# Patient Record
Sex: Female | Born: 1990 | Race: Black or African American | Hispanic: No | Marital: Single | Smoking: Current every day smoker
Health system: Southern US, Community
[De-identification: ages and names within clinical notes are randomized; demographics above are authoritative.]

## PROBLEM LIST (undated history)

## (undated) DIAGNOSIS — A4902 Methicillin resistant Staphylococcus aureus infection, unspecified site: Secondary | ICD-10-CM

## (undated) DIAGNOSIS — Z9289 Personal history of other medical treatment: Secondary | ICD-10-CM

## (undated) DIAGNOSIS — L03221 Cellulitis of neck: Secondary | ICD-10-CM

## (undated) DIAGNOSIS — L0211 Cutaneous abscess of neck: Secondary | ICD-10-CM

## (undated) HISTORY — PX: WISDOM TOOTH EXTRACTION: SHX21

## (undated) HISTORY — PX: TONSILLECTOMY AND ADENOIDECTOMY: SUR1326

## (undated) HISTORY — PX: COLONOSCOPY W/ BIOPSIES: SHX1374

---
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
## 2012-01-21 ENCOUNTER — Other Ambulatory Visit: Payer: Self-pay | Admitting: Pediatrics

## 2012-01-21 DIAGNOSIS — K523 Indeterminate colitis: Secondary | ICD-10-CM

## 2012-01-22 NOTE — Telephone Encounter (Signed)
Here's one 

## 2012-02-21 ENCOUNTER — Other Ambulatory Visit: Payer: Self-pay | Admitting: Pediatrics

## 2012-02-21 ENCOUNTER — Encounter: Payer: Self-pay | Admitting: Pediatrics

## 2012-02-21 ENCOUNTER — Ambulatory Visit (INDEPENDENT_AMBULATORY_CARE_PROVIDER_SITE_OTHER): Payer: Medicaid Other | Admitting: Pediatrics

## 2012-02-21 VITALS — BP 112/59 | HR 75 | Temp 97.1°F | Ht 65.5 in | Wt 252.0 lb

## 2012-02-21 DIAGNOSIS — K523 Indeterminate colitis: Secondary | ICD-10-CM

## 2012-02-21 DIAGNOSIS — E669 Obesity, unspecified: Secondary | ICD-10-CM

## 2012-02-21 DIAGNOSIS — K5289 Other specified noninfective gastroenteritis and colitis: Secondary | ICD-10-CM

## 2012-02-21 LAB — CBC WITH DIFFERENTIAL/PLATELET
Basophils Absolute: 0 10*3/uL (ref 0.0–0.1)
Eosinophils Relative: 8 % — ABNORMAL HIGH (ref 0–5)
HCT: 32.7 % — ABNORMAL LOW (ref 36.0–46.0)
Hemoglobin: 10.3 g/dL — ABNORMAL LOW (ref 12.0–15.0)
Lymphocytes Relative: 23 % (ref 12–46)
Lymphs Abs: 2.1 10*3/uL (ref 0.7–4.0)
MCV: 75.7 fL — ABNORMAL LOW (ref 78.0–100.0)
Monocytes Absolute: 0.4 10*3/uL (ref 0.1–1.0)
Neutro Abs: 5.9 10*3/uL (ref 1.7–7.7)
RBC: 4.32 MIL/uL (ref 3.87–5.11)
RDW: 17.4 % — ABNORMAL HIGH (ref 11.5–15.5)
WBC: 9.1 10*3/uL (ref 4.0–10.5)

## 2012-02-21 LAB — HEPATIC FUNCTION PANEL
AST: 16 U/L (ref 0–37)
Bilirubin, Direct: 0.1 mg/dL (ref 0.0–0.3)
Indirect Bilirubin: 0.4 mg/dL (ref 0.0–0.9)
Total Bilirubin: 0.5 mg/dL (ref 0.3–1.2)

## 2012-02-21 NOTE — Patient Instructions (Signed)
Keep Prednisone 30 mg daily, Immuran 150 mg every morning and iron once daily. Will refer to [facility] for fiurther care due to advanced age.

## 2012-02-21 NOTE — Progress Notes (Signed)
Subjective:     Patient ID: Alexandra Ramsey, female   DOB: 05-09-1991, 21 y.o.   MRN: [id number] BP 112/59  Pulse 75  Temp(Src) 97.1 F (36.2 C) (Oral)  Ht 5' 5.5" (1.664 m)  Wt 252 lb (114.306 kg)  BMI 41.30 kg/m2. 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 Weight stable. Had one week of cramping/bloody stools without interruption of meds/dietary restrictions. Good compliance with Prednisone 30 mg QAM, Imuran 150 mg QAM and FeSO4 325 mg daily. Finishing second quarter of college. No fever, arthralgia, diarrhea, etc  Review of Systems  Constitutional: Negative for fever, activity change, appetite change and unexpected weight change.  HENT: Negative.   Respiratory: Negative.  Negative for cough and wheezing.   Cardiovascular: Negative.  Negative for chest pain.  Gastrointestinal: Negative for nausea, vomiting, abdominal pain, diarrhea, constipation, blood in stool, abdominal distention and rectal pain.  Genitourinary: Negative.  Negative for dysuria, hematuria, flank pain, difficulty urinating and menstrual problem.  Musculoskeletal: Negative.  Negative for arthralgias.  Neurological: Negative.  Negative for headaches.  Hematological: Negative.   Psychiatric/Behavioral: Negative.        Objective:   Physical Exam  Nursing note and vitals reviewed. Constitutional: She appears well-developed and well-nourished. No distress.  HENT:  Head: Normocephalic and atraumatic.  Eyes: Conjunctivae are normal.  Neck: Normal range of motion. Neck supple. No thyromegaly present.  Cardiovascular: Normal rate, regular rhythm and normal heart sounds.   No murmur heard. Pulmonary/Chest: Effort normal and breath sounds normal. She has no wheezes.  Abdominal: Soft. Bowel sounds are normal. She exhibits no distension and no mass. There is no tenderness.  Musculoskeletal: Normal range of motion. She exhibits no edema.  Lymphadenopathy:    She has no cervical adenopathy.    Neurological: She is alert.  Skin: Skin is warm and dry. No rash noted.  Psychiatric: She has a normal mood and affect. Her behavior is normal.       Assessment:   Indeterminate colitis-doing well  Obesity    Plan:   Keep meds same for now  CBC/SR/LFTs Refer to [facility] for 3 month followup appt and ongoing care

## 2012-07-18 ENCOUNTER — Emergency Department (HOSPITAL_COMMUNITY)
Admission: EM | Admit: 2012-07-18 | Discharge: 2012-07-18 | Disposition: A | Discharge status: Home / Self Care | Payer: Medicaid Other | Attending: Emergency Medicine | Admitting: Emergency Medicine

## 2012-07-18 ENCOUNTER — Encounter (HOSPITAL_COMMUNITY): Payer: Self-pay | Admitting: Emergency Medicine

## 2012-07-18 DIAGNOSIS — F17200 Nicotine dependence, unspecified, uncomplicated: Secondary | ICD-10-CM | POA: Insufficient documentation

## 2012-07-18 DIAGNOSIS — J069 Acute upper respiratory infection, unspecified: Secondary | ICD-10-CM | POA: Insufficient documentation

## 2012-07-18 MED ORDER — GUAIFENESIN 100 MG/5ML PO SYRP: 100.0000 mg | ORAL_SOLUTION | ORAL | Status: DC | PRN

## 2012-07-18 MED ORDER — ACETAMINOPHEN 500 MG PO TABS: 500.0000 mg | ORAL_TABLET | Freq: Four times a day (QID) | ORAL | Status: DC | PRN

## 2012-07-18 NOTE — ED Provider Notes (Signed)
History     CSN: [id number]  Arrival date & time 07/18/12  5638   First MD Initiated Contact with Patient 07/18/12 [phone]      No chief complaint on file.   (Consider location/radiation/quality/duration/timing/severity/associated sxs/prior treatment) HPI  21 year old female with history of UC currently on Imuran presents with cold symptoms. Reports for the past 2 days she has had frontal headache, nasal congestion, runny nose, sore throat, ear pain, cough productive of yellow sputum and body aches.  Also endorse 2 bouts of non-bloody diarrhea since yesterday, no fever or vomiting.  Is a smoker.  Has not take any treatment for her sxs.  No abdominal pain, back pain, urinary sxs or rash.  No recent travel, no tick exposure.    Past Medical History  Diagnosis Date  . Ulcerative colitis 05/29/08 & 04/29/10    Colonoscopy with biopsy Dx'd  . Ulcerative colitis     Past Surgical History  Procedure Date  . Colonoscopy w/ biopsies 05/29/08 and 05/08/10    Biopsy consistent with colitis    Family History  Problem Relation Age of Onset  . Colon polyps Father   . Hypertension Father   . Colon polyps Maternal Grandmother   . Colon polyps Paternal Grandmother   . Inflammatory bowel disease Neg Hx   . Hypertension Mother     History  Substance Use Topics  . Smoking status: Former Smoker -- 0.1 packs/day    Types: Cigarettes  . Smokeless tobacco: Not on file  . Alcohol Use: No    OB History    Grav Para Term Preterm Abortions TAB SAB Ect Mult Living                  Review of Systems  All other systems reviewed and are negative.    Allergies  Review of patient's allergies indicates no known allergies.  Home Medications   Current Outpatient Rx  Name Route Sig Dispense Refill  . AZATHIOPRINE 50 MG PO TABS  TAKE THREE TABLETS BY MOUTH EVERY DAY IN THE MORNING 100 tablet 5  . FERROUS SULFATE 325 (65 FE) MG PO TABS Oral Take 1 tablet (325 mg total) by mouth daily. 100 tablet 5   . PREDNISONE 20 MG PO TABS Oral Take 1.5 tablets (30 mg total) by mouth daily. 50 tablet 5    BP 139/76  Pulse 94  Temp 98.6 F (37 C) (Oral)  Resp 16  SpO2 100%  Physical Exam  Nursing note and vitals reviewed. Constitutional: She appears well-developed and well-nourished. No distress.       Awake, alert, nontoxic appearance  HENT:  Head: Atraumatic.  Right Ear: External ear normal.  Left Ear: External ear normal.  Nose: Nose normal.  Mouth/Throat: Oropharynx is clear and moist. No oropharyngeal exudate.  Eyes: Conjunctivae normal are normal. Right eye exhibits no discharge. Left eye exhibits no discharge.  Neck: Neck supple.  Cardiovascular: Normal rate and regular rhythm.   Pulmonary/Chest: Effort normal. No respiratory distress. She has no wheezes. She has no rales. She exhibits no tenderness.  Abdominal: Soft. There is no tenderness. There is no rebound.  Musculoskeletal: She exhibits no tenderness.       ROM appears intact, no obvious focal weakness  Lymphadenopathy:    She has no cervical adenopathy.  Neurological:       Mental status and motor strength appears intact  Skin: No rash noted.  Psychiatric: She has a normal mood and affect.    ED Course  Procedures (including critical care time)  Labs Reviewed - No data to display No results found.   No diagnosis found.  1. URI   MDM  Pt with cold sxs, no obvious signs of infection on exam.  abd nontender.  No rash. Plan to treat with guaifenesin, tylenol.  Pt is afebrile with stable vital sign.    BP 139/76  Pulse 94  Temp 98.6 F (37 C) (Oral)  Resp 16  SpO2 100%  LMP 06/15/2012  Nursing notes reviewed and considered in documentation  Previous records reviewed and considered           Fayrene Helper, PA-C 07/18/12 4098

## 2012-07-18 NOTE — ED Notes (Signed)
Pt states she has had cold sx (sore throat, congestion) x 2 days. Also states diarrhea since last night 3x. Took Nyquil last night for sx.

## 2012-07-22 NOTE — ED Provider Notes (Signed)
Medical screening examination/treatment/procedure(s) were performed by non-physician practitioner and as supervising physician I was immediately available for consultation/collaboration.   Orlandria Kissner E Raciel Caffrey, MD 07/22/12 0846 

## 2012-08-14 ENCOUNTER — Other Ambulatory Visit: Payer: Self-pay | Admitting: Pediatrics

## 2012-08-14 DIAGNOSIS — K523 Indeterminate colitis: Secondary | ICD-10-CM

## 2012-08-15 NOTE — Telephone Encounter (Signed)
Has been referred to adult GI

## 2013-01-08 ENCOUNTER — Other Ambulatory Visit: Payer: Self-pay | Admitting: Gastroenterology

## 2013-01-08 DIAGNOSIS — K529 Noninfective gastroenteritis and colitis, unspecified: Secondary | ICD-10-CM

## 2013-01-08 DIAGNOSIS — R1032 Left lower quadrant pain: Secondary | ICD-10-CM

## 2013-01-14 ENCOUNTER — Inpatient Hospital Stay: Admission: RE | Admit: 2013-01-14 | Payer: Medicaid Other | Source: Ambulatory Visit

## 2013-03-16 ENCOUNTER — Encounter (HOSPITAL_COMMUNITY): Payer: Self-pay | Admitting: *Deleted

## 2013-03-16 ENCOUNTER — Emergency Department (INDEPENDENT_AMBULATORY_CARE_PROVIDER_SITE_OTHER)
Admission: EM | Admit: 2013-03-16 | Discharge: 2013-03-16 | Disposition: A | Discharge status: Home / Self Care | Payer: Medicaid Other | Source: Home / Self Care

## 2013-03-16 DIAGNOSIS — K529 Noninfective gastroenteritis and colitis, unspecified: Secondary | ICD-10-CM

## 2013-03-16 DIAGNOSIS — K5289 Other specified noninfective gastroenteritis and colitis: Secondary | ICD-10-CM

## 2013-03-16 MED ORDER — METHYLPREDNISOLONE ACETATE 40 MG/ML IJ SUSP
80.0000 mg | Freq: Once | INTRAMUSCULAR | Status: AC
  Administered 2013-03-16: 80 mg via INTRAMUSCULAR

## 2013-03-16 MED ORDER — METHYLPREDNISOLONE ACETATE 80 MG/ML IJ SUSP
INTRAMUSCULAR | Status: AC
  Filled 2013-03-16: qty 1

## 2013-03-16 MED ORDER — METHYLPREDNISOLONE 4 MG PO KIT: PACK | ORAL | Status: DC

## 2013-03-16 NOTE — ED Provider Notes (Signed)
History     CSN: [id number]  Arrival date & time 03/16/13  1134   None     Chief Complaint  Patient presents with  . Abdominal Pain    (Consider location/radiation/quality/duration/timing/severity/associated sxs/prior treatment) Patient is a 22 y.o. female presenting with abdominal pain. The history is provided by the patient.  Abdominal Pain Pain location:  Periumbilical Pain quality: cramping   Pain radiates to:  Suprapubic region Pain severity:  Mild Onset quality:  Sudden Duration:  2 days Timing:  Intermittent Progression:  Worsening Chronicity:  Chronic Context comment:  H/o UC with mult meds, onset yest with cramps, today with nonbloody diarrhea,similar to prev flare-ups. Associated symptoms: no fever, no nausea and no vomiting     Past Medical History  Diagnosis Date  . Ulcerative colitis 05/29/08 & 04/29/10    Colonoscopy with biopsy Dx'd  . Ulcerative colitis     Past Surgical History  Procedure Laterality Date  . Colonoscopy w/ biopsies  05/29/08 and 05/08/10    Biopsy consistent with colitis    Family History  Problem Relation Age of Onset  . Colon polyps Father   . Hypertension Father   . Colon polyps Maternal Grandmother   . Colon polyps Paternal Grandmother   . Inflammatory bowel disease Neg Hx   . Hypertension Mother     History  Substance Use Topics  . Smoking status: Former Smoker -- 0.10 packs/day    Types: Cigarettes  . Smokeless tobacco: Not on file  . Alcohol Use: No    OB History   Grav Para Term Preterm Abortions TAB SAB Ect Mult Living                  Review of Systems  Constitutional: Negative for fever.  Gastrointestinal: Positive for abdominal pain. Negative for nausea and vomiting.  Genitourinary: Negative.     Allergies  Review of patient's allergies indicates no known allergies.  Home Medications   Current Outpatient Rx  Name  Route  Sig  Dispense  Refill  . azaTHIOprine (IMURAN) 50 MG tablet   Oral   Take  150 mg by mouth daily.         Marland Kitchen acetaminophen (TYLENOL) 500 MG tablet   Oral   Take 1 tablet (500 mg total) by mouth every 6 (six) hours as needed for pain.   30 tablet   0   . EXPIRED: ferrous sulfate 325 (65 FE) MG tablet   Oral   Take 325 mg by mouth daily.         Marland Kitchen guaifenesin (ROBITUSSIN) 100 MG/5ML syrup   Oral   Take 5-10 mLs (100-200 mg total) by mouth every 4 (four) hours as needed for cough.   60 mL   0   . methylPREDNISolone (MEDROL DOSEPAK) 4 MG tablet      follow package directions, start on mon, take until finished.   21 tablet   0   . EXPIRED: predniSONE (DELTASONE) 20 MG tablet   Oral   Take 30 mg by mouth daily.         . predniSONE (DELTASONE) 20 MG tablet      TAKE ONE TABLET BY MOUTH AS DIRECTED   60 tablet   4     BP 127/71  Pulse 86  Temp(Src) 98.1 F (36.7 C) (Oral)  Resp 15  SpO2 98%  LMP 02/28/2013  Physical Exam  Nursing note and vitals reviewed. Constitutional: She is oriented to person, place, and time.  She appears well-developed and well-nourished. No distress.  Pain 6/10.  Neck: Normal range of motion. Neck supple.  Cardiovascular: Regular rhythm.   Pulmonary/Chest: Breath sounds normal.  Abdominal: Soft. She exhibits no distension and no mass. Bowel sounds are decreased. There is no hepatosplenomegaly. There is tenderness in the periumbilical area and suprapubic area. There is no rigidity, no rebound, no guarding and no CVA tenderness.  Lymphadenopathy:    She has no cervical adenopathy.  Neurological: She is alert and oriented to person, place, and time.  Skin: Skin is warm and dry.    ED Course  Procedures (including critical care time)  Labs Reviewed - No data to display No results found.   1. Acute colitis       MDM          Linna Hoff, MD 03/16/13 1253

## 2013-03-16 NOTE — ED Notes (Signed)
Patient complains of abdominal pain in the bottom of her stomach. Sates she thought it is a flair up as she has ulcerative colitis. States Diarrhea. Denies fever/chills.

## 2013-06-14 ENCOUNTER — Encounter (HOSPITAL_COMMUNITY): Payer: Self-pay | Admitting: Emergency Medicine

## 2013-06-14 ENCOUNTER — Emergency Department (HOSPITAL_COMMUNITY)
Admission: EM | Admit: 2013-06-14 | Discharge: 2013-06-14 | Disposition: A | Discharge status: Home / Self Care | Payer: Self-pay | Attending: Emergency Medicine | Admitting: Emergency Medicine

## 2013-06-14 DIAGNOSIS — Z87891 Personal history of nicotine dependence: Secondary | ICD-10-CM | POA: Insufficient documentation

## 2013-06-14 DIAGNOSIS — Z79899 Other long term (current) drug therapy: Secondary | ICD-10-CM | POA: Insufficient documentation

## 2013-06-14 DIAGNOSIS — Z8719 Personal history of other diseases of the digestive system: Secondary | ICD-10-CM | POA: Insufficient documentation

## 2013-06-14 DIAGNOSIS — R209 Unspecified disturbances of skin sensation: Secondary | ICD-10-CM | POA: Insufficient documentation

## 2013-06-14 DIAGNOSIS — M25539 Pain in unspecified wrist: Secondary | ICD-10-CM | POA: Insufficient documentation

## 2013-06-14 DIAGNOSIS — IMO0002 Reserved for concepts with insufficient information to code with codable children: Secondary | ICD-10-CM | POA: Insufficient documentation

## 2013-06-14 DIAGNOSIS — M25449 Effusion, unspecified hand: Secondary | ICD-10-CM | POA: Insufficient documentation

## 2013-06-14 DIAGNOSIS — M25532 Pain in left wrist: Secondary | ICD-10-CM

## 2013-06-14 MED ORDER — IBUPROFEN 400 MG PO TABS
800.0000 mg | ORAL_TABLET | Freq: Once | ORAL | Status: AC
  Administered 2013-06-14: 800 mg via ORAL
  Filled 2013-06-14: qty 2

## 2013-06-14 MED ORDER — METHOCARBAMOL 500 MG PO TABS: 500.0000 mg | ORAL_TABLET | Freq: Two times a day (BID) | ORAL | Status: DC

## 2013-06-14 MED ORDER — IBUPROFEN 800 MG PO TABS: 800.0000 mg | ORAL_TABLET | Freq: Three times a day (TID) | ORAL | Status: DC

## 2013-06-14 NOTE — ED Provider Notes (Signed)
CSN: [id number]     Arrival date & time 06/14/13  2203 History  This chart was scribed for non-physician practitioner, Fayrene Helper, PA-C working with Gavin Pound. Oletta Lamas, MD by Greggory Stallion, ED scribe. This patient was seen in room TR06C/TR06C and the patient's care was started at 10:15 PM.   Chief Complaint  Patient presents with  . Wrist Pain   The history is provided by the patient. No language interpreter was used.    HPI Comments: Alexandra Ramsey is a 22 y.o. female who presents to the Emergency Department complaining of gradual onset, constant throbbing left wrist pain and swelling that started around 6 PM tonight while she was at work. She denies injury. Pt is right handed. She states certain movements worsen the pain. Pt has some numbness in her fingers. She has not taken anything to relieve the symptoms. Pt denies fever, chills and shoulder pain as associated symptoms. She states no one at work is having any pain or swelling.   Past Medical History  Diagnosis Date  . Ulcerative colitis 05/29/08 & 04/29/10    Colonoscopy with biopsy Dx'd  . Ulcerative colitis    Past Surgical History  Procedure Laterality Date  . Colonoscopy w/ biopsies  05/29/08 and 05/08/10    Biopsy consistent with colitis   Family History  Problem Relation Age of Onset  . Colon polyps Father   . Hypertension Father   . Colon polyps Maternal Grandmother   . Colon polyps Paternal Grandmother   . Inflammatory bowel disease Neg Hx   . Hypertension Mother    History  Substance Use Topics  . Smoking status: Former Smoker -- 0.10 packs/day    Types: Cigarettes  . Smokeless tobacco: Not on file  . Alcohol Use: No   OB History   Grav Para Term Preterm Abortions TAB SAB Ect Mult Living                 Review of Systems  Musculoskeletal: Positive for joint swelling and arthralgias.  Neurological: Positive for numbness.  All other systems reviewed and are negative.    Allergies  Review of patient's  allergies indicates no known allergies.  Home Medications   Current Outpatient Rx  Name  Route  Sig  Dispense  Refill  . acetaminophen (TYLENOL) 500 MG tablet   Oral   Take 1 tablet (500 mg total) by mouth every 6 (six) hours as needed for pain.   30 tablet   0   . azaTHIOprine (IMURAN) 50 MG tablet   Oral   Take 150 mg by mouth daily.         Marland Kitchen EXPIRED: ferrous sulfate 325 (65 FE) MG tablet   Oral   Take 325 mg by mouth daily.         Marland Kitchen guaifenesin (ROBITUSSIN) 100 MG/5ML syrup   Oral   Take 5-10 mLs (100-200 mg total) by mouth every 4 (four) hours as needed for cough.   60 mL   0   . methylPREDNISolone (MEDROL DOSEPAK) 4 MG tablet      follow package directions, start on mon, take until finished.   21 tablet   0   . EXPIRED: predniSONE (DELTASONE) 20 MG tablet   Oral   Take 30 mg by mouth daily.         . predniSONE (DELTASONE) 20 MG tablet      TAKE ONE TABLET BY MOUTH AS DIRECTED   60 tablet   4  BP 128/70  Pulse 78  Temp(Src) 98.2 F (36.8 C) (Oral)  Resp 16  SpO2 100%  LMP 05/18/2013  Physical Exam  Nursing note and vitals reviewed. Constitutional: She is oriented to person, place, and time. She appears well-developed and well-nourished. No distress.  HENT:  Head: Normocephalic and atraumatic.  Eyes: EOM are normal.  Neck: Neck supple. No tracheal deviation present.  Cardiovascular: Normal rate.   Brisk cap refill. Radial pulses 2+.   Pulmonary/Chest: Effort normal. No respiratory distress.  Musculoskeletal: Normal range of motion.  Left wrist mod tender throught wrist with edema noted. Increased pain with wrist flexion and extension and arm sublation. Decreased grip strength due to pain. No deformity. No rash. Normal left elbow and left shoulder.   Neurological: She is alert and oriented to person, place, and time.  Skin: Skin is warm and dry.  Psychiatric: She has a normal mood and affect. Her behavior is normal.    ED Course    Procedures (including critical care time)  DIAGNOSTIC STUDIES: Oxygen Saturation is 100% on RA, normal by my interpretation.    COORDINATION OF CARE: 10:41 PM-Discussed treatment plan which includes wrapping wrist and pain medication with pt at bedside and pt agreed to plan. Advised pt xray is not necessary since she did not injured the wrist and to follow up with orthopaedist if symptoms do not resolve. Spoke with pt there is no septic arthritis since she has no fevers. Doubt gout due to age. Pt was given warning signs of infection to return to ED.   Labs Reviewed - No data to display No results found. 1. Left wrist pain     MDM  BP 128/70  Pulse 78  Temp(Src) 98.2 F (36.8 C) (Oral)  Resp 16  SpO2 100%  LMP 05/18/2013   I personally performed the services described in this documentation, which was scribed in my presence. The recorded information has been reviewed and is accurate.    Fayrene Helper, PA-C 06/14/13 2248

## 2013-06-14 NOTE — ED Notes (Signed)
PT. REPORTS LEFT WRIST PAIN / SWELLING ONSET THIS EVENING , DENIES INJURY.

## 2013-06-15 NOTE — ED Provider Notes (Signed)
Medical screening examination/treatment/procedure(s) were performed by non-physician practitioner and as supervising physician I was immediately available for consultation/collaboration.   Nicodemus Denk Y. Veryl Abril, MD 06/15/13 1738 

## 2013-06-24 ENCOUNTER — Other Ambulatory Visit: Payer: Self-pay | Admitting: Pediatrics

## 2013-06-25 ENCOUNTER — Other Ambulatory Visit: Payer: Self-pay | Admitting: Pediatrics

## 2013-06-25 NOTE — Telephone Encounter (Signed)
Isn't she gone?

## 2013-06-27 ENCOUNTER — Encounter (HOSPITAL_COMMUNITY): Payer: Self-pay | Admitting: Emergency Medicine

## 2013-06-27 ENCOUNTER — Emergency Department (HOSPITAL_COMMUNITY): Payer: Self-pay

## 2013-06-27 ENCOUNTER — Emergency Department (HOSPITAL_COMMUNITY)
Admission: EM | Admit: 2013-06-27 | Discharge: 2013-06-27 | Disposition: A | Discharge status: Home / Self Care | Payer: Self-pay | Attending: Emergency Medicine | Admitting: Emergency Medicine

## 2013-06-27 DIAGNOSIS — S0510XA Contusion of eyeball and orbital tissues, unspecified eye, initial encounter: Secondary | ICD-10-CM | POA: Insufficient documentation

## 2013-06-27 DIAGNOSIS — K5100 Ulcerative (chronic) pancolitis without complications: Secondary | ICD-10-CM | POA: Insufficient documentation

## 2013-06-27 DIAGNOSIS — Z79899 Other long term (current) drug therapy: Secondary | ICD-10-CM | POA: Insufficient documentation

## 2013-06-27 DIAGNOSIS — S0512XA Contusion of eyeball and orbital tissues, left eye, initial encounter: Secondary | ICD-10-CM

## 2013-06-27 DIAGNOSIS — Z87891 Personal history of nicotine dependence: Secondary | ICD-10-CM | POA: Insufficient documentation

## 2013-06-27 MED ORDER — TETRACAINE HCL 0.5 % OP SOLN
1.0000 [drp] | Freq: Once | OPHTHALMIC | Status: AC
  Administered 2013-06-27: 1 [drp] via OPHTHALMIC
  Filled 2013-06-27: qty 2

## 2013-06-27 MED ORDER — FLUORESCEIN SODIUM 1 MG OP STRP
1.0000 | ORAL_STRIP | Freq: Once | OPHTHALMIC | Status: AC
  Administered 2013-06-27: 1 via OPHTHALMIC
  Filled 2013-06-27: qty 1
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
## 2013-06-27 NOTE — ED Notes (Signed)
Discharge instructions reviewed. Pt verbalized understanding.  

## 2013-06-27 NOTE — ED Provider Notes (Signed)
Medical screening examination/treatment/procedure(s) were performed by non-physician practitioner and as supervising physician I was immediately available for consultation/collaboration.   Shanna Cisco, MD 06/27/13 1958
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
## 2013-07-12 ENCOUNTER — Emergency Department (HOSPITAL_COMMUNITY)
Admission: EM | Admit: 2013-07-12 | Discharge: 2013-07-12 | Disposition: A | Discharge status: Home / Self Care | Payer: Medicaid Other | Attending: Emergency Medicine | Admitting: Emergency Medicine

## 2013-07-12 ENCOUNTER — Encounter (HOSPITAL_COMMUNITY): Payer: Self-pay | Admitting: Emergency Medicine

## 2013-07-12 DIAGNOSIS — S0011XA Contusion of right eyelid and periocular area, initial encounter: Secondary | ICD-10-CM

## 2013-07-12 DIAGNOSIS — S0010XA Contusion of unspecified eyelid and periocular area, initial encounter: Secondary | ICD-10-CM | POA: Insufficient documentation

## 2013-07-12 DIAGNOSIS — IMO0002 Reserved for concepts with insufficient information to code with codable children: Secondary | ICD-10-CM | POA: Insufficient documentation

## 2013-07-12 DIAGNOSIS — Z87891 Personal history of nicotine dependence: Secondary | ICD-10-CM | POA: Insufficient documentation

## 2013-07-12 DIAGNOSIS — Z79899 Other long term (current) drug therapy: Secondary | ICD-10-CM | POA: Insufficient documentation

## 2013-07-12 DIAGNOSIS — K5190 Ulcerative colitis, unspecified, without complications: Secondary | ICD-10-CM | POA: Insufficient documentation

## 2013-07-12 MED ORDER — TETRACAINE HCL 0.5 % OP SOLN
1.0000 [drp] | Freq: Once | OPHTHALMIC | Status: AC
  Administered 2013-07-12: 1 [drp] via OPHTHALMIC
  Filled 2013-07-12: qty 2

## 2013-07-12 MED ORDER — FLUORESCEIN SODIUM 1 MG OP STRP
1.0000 | ORAL_STRIP | Freq: Once | OPHTHALMIC | Status: AC
  Administered 2013-07-12: 1 via OPHTHALMIC
  Filled 2013-07-12: qty 1

## 2013-07-12 MED ORDER — HYDROCODONE-ACETAMINOPHEN 5-325 MG PO TABS: 2.0000 | ORAL_TABLET | ORAL | Status: DC | PRN
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
## 2013-07-13 NOTE — ED Provider Notes (Signed)
Medical screening examination/treatment/procedure(s) were performed by non-physician practitioner and as supervising physician I was immediately available for consultation/collaboration.  Nyaja Dubuque L Trenton Passow, MD 07/13/13 0106 

## 2013-10-22 ENCOUNTER — Encounter (HOSPITAL_COMMUNITY): Payer: Self-pay | Admitting: Emergency Medicine

## 2013-10-22 ENCOUNTER — Emergency Department (INDEPENDENT_AMBULATORY_CARE_PROVIDER_SITE_OTHER)
Admission: EM | Admit: 2013-10-22 | Discharge: 2013-10-22 | Disposition: A | Discharge status: Home / Self Care | Payer: Self-pay | Source: Home / Self Care | Attending: Family Medicine | Admitting: Family Medicine

## 2013-10-22 DIAGNOSIS — R109 Unspecified abdominal pain: Secondary | ICD-10-CM

## 2013-10-22 DIAGNOSIS — K5289 Other specified noninfective gastroenteritis and colitis: Secondary | ICD-10-CM

## 2013-10-22 DIAGNOSIS — K523 Indeterminate colitis: Secondary | ICD-10-CM

## 2013-10-22 MED ORDER — PREDNISONE 20 MG PO TABS: 20.0000 mg | ORAL_TABLET | Freq: Two times a day (BID) | ORAL | Status: DC

## 2013-10-22 NOTE — ED Provider Notes (Signed)
Alexandra Ramsey is a 22 y.o. female who presents to Urgent Care today for abdominal pain for the last 2 days. Patient has a medical history significant for ulcerative colitis. This was previously well controlled with prednisone. She was taking 20 mg of prednisone twice daily until November when her medication ran out. She has not followed up with her doctor or a gastroenterologist as instructed for this and does not have medication refills. Additionally her medication has expired and she has not attempted to refill this as well. She was doing reasonably well up until 2 days ago. She denies any diarrhea. She says that her mild abdominal pain is consistent with very early ulcerative colitis flare. She is concerned that she may get worse. No fevers or chills nausea vomiting or diarrhea.   Past Medical History  Diagnosis Date  . Ulcerative colitis 05/29/08 & 04/29/10    Colonoscopy with biopsy Dx'd  . Ulcerative colitis    History  Substance Use Topics  . Smoking status: Former Smoker -- 0.10 packs/day    Types: Cigarettes  . Smokeless tobacco: Not on file  . Alcohol Use: No   ROS as above Medications reviewed. No current facility-administered medications for this encounter.   Current Outpatient Prescriptions  Medication Sig Dispense Refill  . HYDROcodone-acetaminophen (NORCO/VICODIN) 5-325 MG per tablet Take 2 tablets by mouth every 4 (four) hours as needed for pain.  13 tablet  0  . predniSONE (DELTASONE) 20 MG tablet Take 1 tablet (20 mg total) by mouth 2 (two) times daily with a meal.  60 tablet  0    Exam:  BP 105/51  Pulse 75  Temp(Src) 98.7 F (37.1 C) (Oral)  Resp 16  SpO2 100%  LMP 10/10/2013 Gen: Well NAD obese HEENT: EOMI,  MMM Lungs: Normal work of breathing. CTABL Heart: RRR no MRG Abd: NABS, Soft. NT, ND Exts: Non edematous BL  LE, warm and well perfused.   Assessment and Plan: 22 y.o. female with mild abdominal pain consistent with ulcerative colitis flare. Plan to  restart prednisone. Patient should followup with her primary care provider. She made him to apply for Medicaid or inquire about the affordable care act.  I provided multiple resource options for the patient. I additionally stressed the importance of paying attention to and taking care of this potentially very serious chronic medical problem. Discussed warning signs or symptoms. Please see discharge instructions. Patient expresses understanding.      Rodolph Bong, MD 10/22/13 [phone]

## 2013-10-22 NOTE — ED Notes (Signed)
22 yr old is here today with complaints of stomach pain since Monday. Pt states she has a Hx of Ulcerative Colitis. Denies: SOB, Chest PAin

## 2013-12-20 ENCOUNTER — Emergency Department (HOSPITAL_COMMUNITY)
Admission: EM | Admit: 2013-12-20 | Discharge: 2013-12-20 | Disposition: A | Discharge status: Home / Self Care | Payer: Medicaid Other | Attending: Emergency Medicine | Admitting: Emergency Medicine

## 2013-12-20 ENCOUNTER — Encounter (HOSPITAL_COMMUNITY): Payer: Self-pay | Admitting: Emergency Medicine

## 2013-12-20 ENCOUNTER — Emergency Department (HOSPITAL_COMMUNITY): Payer: Medicaid Other

## 2013-12-20 DIAGNOSIS — Z8719 Personal history of other diseases of the digestive system: Secondary | ICD-10-CM | POA: Insufficient documentation

## 2013-12-20 DIAGNOSIS — S93409A Sprain of unspecified ligament of unspecified ankle, initial encounter: Secondary | ICD-10-CM | POA: Insufficient documentation

## 2013-12-20 DIAGNOSIS — S93401A Sprain of unspecified ligament of right ankle, initial encounter: Secondary | ICD-10-CM

## 2013-12-20 DIAGNOSIS — Z87891 Personal history of nicotine dependence: Secondary | ICD-10-CM | POA: Insufficient documentation

## 2013-12-20 DIAGNOSIS — Y9301 Activity, walking, marching and hiking: Secondary | ICD-10-CM | POA: Insufficient documentation

## 2013-12-20 DIAGNOSIS — W010XXA Fall on same level from slipping, tripping and stumbling without subsequent striking against object, initial encounter: Secondary | ICD-10-CM | POA: Insufficient documentation

## 2013-12-20 DIAGNOSIS — X500XXA Overexertion from strenuous movement or load, initial encounter: Secondary | ICD-10-CM | POA: Insufficient documentation

## 2013-12-20 DIAGNOSIS — Z8781 Personal history of (healed) traumatic fracture: Secondary | ICD-10-CM | POA: Insufficient documentation

## 2013-12-20 DIAGNOSIS — Z79899 Other long term (current) drug therapy: Secondary | ICD-10-CM | POA: Insufficient documentation

## 2013-12-20 DIAGNOSIS — Y929 Unspecified place or not applicable: Secondary | ICD-10-CM | POA: Insufficient documentation

## 2013-12-20 DIAGNOSIS — IMO0002 Reserved for concepts with insufficient information to code with codable children: Secondary | ICD-10-CM | POA: Insufficient documentation

## 2013-12-20 DIAGNOSIS — Z791 Long term (current) use of non-steroidal anti-inflammatories (NSAID): Secondary | ICD-10-CM | POA: Insufficient documentation

## 2013-12-20 MED ORDER — IBUPROFEN 800 MG PO TABS: 800.0000 mg | ORAL_TABLET | Freq: Three times a day (TID) | ORAL | Status: DC

## 2013-12-20 MED ORDER — ACETAMINOPHEN ER 650 MG PO TBCR: 650.0000 mg | EXTENDED_RELEASE_TABLET | Freq: Three times a day (TID) | ORAL | Status: DC | PRN

## 2013-12-20 MED ORDER — ACETAMINOPHEN 325 MG PO TABS
650.0000 mg | ORAL_TABLET | Freq: Once | ORAL | Status: AC
  Administered 2013-12-20: 650 mg via ORAL
  Filled 2013-12-20: qty 2

## 2013-12-20 MED ORDER — IBUPROFEN 400 MG PO TABS
800.0000 mg | ORAL_TABLET | Freq: Once | ORAL | Status: DC
  Filled 2013-12-20: qty 2
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
## 2013-12-20 NOTE — ED Notes (Signed)
Back from xray

## 2013-12-20 NOTE — Discharge Instructions (Signed)
Please call your doctor for a followup appointment within 24-48 hours. When you talk to your doctor please let them know that you were seen in the emergency department and have them acquire all of your records so that they can discuss the findings with you and formulate a treatment plan to fully care for your new and ongoing problems. Please call and set-up an appointment with Dr. Larey Dresser, orthopedics, regarding ankle pain  Please rest and stay hydrated Please keep foot elevated and toes above nose to aid in swelling reduction Please apply ice at least 3-4 times per day Please avoid any physical or strenuous activity Please take Ibuprofen for pain  Please continue to monitor symptoms closely and if symptoms are to worsen or change (fever greater than 101, chills, sweating, nausea, vomiting, numbness, tingling, weakness, fall, injury, red streaks) please report back to the ED immediately   Ankle Sprain An ankle sprain is an injury to the strong, fibrous tissues (ligaments) that hold the bones of your ankle joint together.  CAUSES An ankle sprain is usually caused by a fall or by twisting your ankle. Ankle sprains most commonly occur when you step on the outer edge of your foot, and your ankle turns inward. People who participate in sports are more prone to these types of injuries.  SYMPTOMS   Pain in your ankle. The pain may be present at rest or only when you are trying to stand or walk.  Swelling.  Bruising. Bruising may develop immediately or within 1 to 2 days after your injury.  Difficulty standing or walking, particularly when turning corners or changing directions. DIAGNOSIS  Your caregiver will ask you details about your injury and perform a physical exam of your ankle to determine if you have an ankle sprain. During the physical exam, your caregiver will press on and apply pressure to specific areas of your foot and ankle. Your caregiver will try to move your ankle in certain ways.  An X-ray exam may be done to be sure a bone was not broken or a ligament did not separate from one of the bones in your ankle (avulsion fracture).  TREATMENT  Certain types of braces can help stabilize your ankle. Your caregiver can make a recommendation for this. Your caregiver may recommend the use of medicine for pain. If your sprain is severe, your caregiver may refer you to a surgeon who helps to restore function to parts of your skeletal system (orthopedist) or a physical therapist. HOME CARE INSTRUCTIONS   Apply ice to your injury for 1 2 days or as directed by your caregiver. Applying ice helps to reduce inflammation and pain.  Put ice in a plastic bag.  Place a towel between your skin and the bag.  Leave the ice on for 15-20 minutes at a time, every 2 hours while you are awake.  Only take over-the-counter or prescription medicines for pain, discomfort, or fever as directed by your caregiver.  Elevate your injured ankle above the level of your heart as much as possible for 2 3 days.  If your caregiver recommends crutches, use them as instructed. Gradually put weight on the affected ankle. Continue to use crutches or a cane until you can walk without feeling pain in your ankle.  If you have a plaster splint, wear the splint as directed by your caregiver. Do not rest it on anything harder than a pillow for the first 24 hours. Do not put weight on it. Do not get it wet.  You may take it off to take a shower or bath.  You may have been given an elastic bandage to wear around your ankle to provide support. If the elastic bandage is too tight (you have numbness or tingling in your foot or your foot becomes cold and blue), adjust the bandage to make it comfortable.  If you have an air splint, you may blow more air into it or let air out to make it more comfortable. You may take your splint off at night and before taking a shower or bath. Wiggle your toes in the splint several times per day to  decrease swelling. SEEK MEDICAL CARE IF:   You have rapidly increasing bruising or swelling.  Your toes feel extremely cold or you lose feeling in your foot.  Your pain is not relieved with medicine. SEEK IMMEDIATE MEDICAL CARE IF:  Your toes are numb or blue.  You have severe pain that is increasing. MAKE SURE YOU:   Understand these instructions.  Will watch your condition.  Will get help right away if you are not doing well or get worse. Document Released: 10/16/2005 Document Revised: 07/10/2012 Document Reviewed: 10/28/2011 [facility] Patient Information 2014 [place], [place].

## 2013-12-20 NOTE — ED Provider Notes (Signed)
CSN: [id number]     Arrival date & time 12/20/13  1245 History   None   This chart was scribed for Alexandra BergamoMarissa Peighton Edgin PA-C, a non-physician practitioner working with Alexandra Batonourtney F Horton, MD by Alexandra RifeAlexandra Ramsey, ED Scribe. This patient was seen in room TR10C/TR10C and the patient's care was started at 1:34 PM     Chief Complaint  Patient presents with  . Ankle Pain     (Consider location/radiation/quality/duration/timing/severity/associated sxs/prior Treatment) The history is provided by the patient. No language interpreter was used.   HPI Comments: Alexandra Ramsey is a 23 y.o. female who presents to the Emergency Department complaining of constant right ankle pain radiating to anterior mid-shin onset 3 days after inverting ankle during mechanical fall while walking on ice. Describes pain as throbbing and worsening in severity. Reports associated swelling. Reports pain is exacerbated with weight bearing. Denies trying any alleviating factors. Denies associated other injuries, numbness, head injury, and LOC. Reports PMHx of fracture to right ankle 7 years ago.   Past Medical History  Diagnosis Date  . Ulcerative colitis 05/29/08 & 04/29/10    Colonoscopy with biopsy Dx'd  . Ulcerative colitis    Past Surgical History  Procedure Laterality Date  . Colonoscopy w/ biopsies  05/29/08 and 05/08/10    Biopsy consistent with colitis   Family History  Problem Relation Age of Onset  . Colon polyps Father   . Hypertension Father   . Colon polyps Maternal Grandmother   . Colon polyps Paternal Grandmother   . Inflammatory bowel disease Neg Hx   . Hypertension Mother    History  Substance Use Topics  . Smoking status: Former Smoker -- 0.10 packs/day    Types: Cigarettes  . Smokeless tobacco: Not on file  . Alcohol Use: No   OB History   Grav Para Term Preterm Abortions TAB SAB Ect Mult Living                 Review of Systems  Constitutional: Negative for fever.  Musculoskeletal: Positive  for arthralgias.  Skin: Negative for wound.  Neurological: Negative for numbness.  Psychiatric/Behavioral: Negative for confusion.      Allergies  Review of patient's allergies indicates no known allergies.  Home Medications   Current Outpatient Rx  Name  Route  Sig  Dispense  Refill  . azaTHIOprine (IMURAN) 50 MG tablet   Oral   Take 50 mg by mouth as needed (for UC flare).         . IRON PO   Oral   Take 1 tablet by mouth daily.         . predniSONE (DELTASONE) 20 MG tablet   Oral   Take 1 tablet (20 mg total) by mouth 2 (two) times daily with a meal.   60 tablet   0   . ibuprofen (ADVIL,MOTRIN) 800 MG tablet   Oral   Take 1 tablet (800 mg total) by mouth 3 (three) times daily.   21 tablet   0    BP 111/67  Pulse 61  Temp(Src) 98.7 F (37.1 C) (Oral)  Resp 18  SpO2 100%  LMP 12/03/2013 Physical Exam  Nursing note and vitals reviewed. Constitutional: She is oriented to person, place, and time. She appears well-developed and well-nourished.  Cardiovascular: Normal rate, regular rhythm and normal heart sounds.  Exam reveals no friction rub.   No murmur heard. Pulses:      Radial pulses are 2+ on the right side, and  2+ on the left side.       Dorsalis pedis pulses are 2+ on the right side, and 2+ on the left side.       Posterior tibial pulses are 2+ on the right side, and 2+ on the left side.  Pulmonary/Chest: Effort normal and breath sounds normal. No respiratory distress. She has no wheezes. She has no rales.  Musculoskeletal: She exhibits tenderness.  Swelling localized to the lateral aspects of the right ankle-lateral malleolus with discomfort upon palpation. Negative erythema, ecchymosis, deformities noted to the right ankle. Decreased range of motion to the right ankle secondary to pain-decreased inversion and eversion, dorsiflexion and plantarflexion. Full range of motion to the digits the right foot-patient is able to wiggle toes. Negative pain upon  palpation to the right foot.  Neurological: She is alert and oriented to person, place, and time. No cranial nerve deficit. She exhibits normal muscle tone. Coordination normal.  Cranial nerves III-XII grossly intact Strength 5+/5+ to lower extremities bilaterally with resistance applied, equal distribution noted - minimally decreased to the right foot secondary to ankle pain Strength intact to the digits of the right and left foot bilaterally with resistance applied Sensation intact with differentiation to sharp and dull touch to the right lower extremity and right foot   Skin: Skin is warm and dry. No rash noted. No erythema.  Psychiatric: She has a normal mood and affect. Her behavior is normal. Thought content normal.    ED Course  Procedures  COORDINATION OF CARE:  Nursing notes reviewed. Vital signs reviewed. Initial pt interview and examination performed.   1:32 PM-Discussed work up plan with pt at bedside, which includes x-ray of right ankle. Pt agrees with plan.  Dg Ankle Complete Right  12/20/2013   CLINICAL DATA:  Slipped on ice and injured the right ankle, lateral pain.  EXAM: RIGHT ANKLE - COMPLETE 3+ VIEW  COMPARISON:  None.  FINDINGS: Lateral and dorsal soft tissue swelling. No evidence of acute fracture. Ankle mortise intact with well-preserved joint space. Well-preserved bone mineral density. No intrinsic osseous abnormalities. No visible joint effusion.  IMPRESSION: No osseous abnormality.   Electronically Signed   By: Hulan Saas M.D.   On: 12/20/2013 14:06    Treatment plan initiated:Medications - No data to display   Initial diagnostic testing ordered.     Labs Review Labs Reviewed - No data to display Imaging Review Dg Ankle Complete Right  12/20/2013   CLINICAL DATA:  Slipped on ice and injured the right ankle, lateral pain.  EXAM: RIGHT ANKLE - COMPLETE 3+ VIEW  COMPARISON:  None.  FINDINGS: Lateral and dorsal soft tissue swelling. No evidence of acute  fracture. Ankle mortise intact with well-preserved joint space. Well-preserved bone mineral density. No intrinsic osseous abnormalities. No visible joint effusion.  IMPRESSION: No osseous abnormality.   Electronically Signed   By: Hulan Saas M.D.   On: 12/20/2013 14:06    EKG Interpretation   None       MDM   Final diagnoses:  Right ankle sprain    Filed Vitals:   12/20/13 1303 12/20/13 1425  BP: 127/75 111/67  Pulse: 78 61  Temp: 97.7 F (36.5 C) 98.7 F (37.1 C)  TempSrc: Oral Oral  Resp: 16 18  SpO2: 99% 100%   I personally performed the services described in this documentation, which was scribed in my presence. The recorded information has been reviewed and is accurate.  Patient presenting to the emergency department with right  ankle pain that started Thursday after patient slipped on ice and landed on her right ankle in an inverted manner. Stated that she had an injury to her right ankle-broke her right ankle approximately 7 years ago. Stated that she has a throbbing sensation that is constant the right ankle with radiation up to the mid-tib-fib region of the right lower extremity. Reported that the pain is worse with applying pressure. Stated that has not elevated or iced. Denied any use of over-the-counter medication. Alert and oriented. GCS 15. Heart rate and rhythm normal. Lungs good auscultation. Radial, DP, PT pulses 2+ bilaterally. Cap refill less than 3 seconds. Swelling localized to lateral malleolus of the right ankle-negative erythema, red streaks, ecchymosis, deformities noted. Decreased range of motion secondary to pain to the right ankle. Patient is able to wiggle toes bilaterally without difficulty. Strength intact to digits of right foot and left foot with resistance applied, equal distribution noted. Sensation intact with differentiation sharp and dull touch. Right ankle plain film negative for acute osseous injury. Negative findings of fracture. Negative  findings of dislocation. Suspicion to be ankle sprain. Patient placed in ASO brace and crutches given for comfort. Patient neurovascularly intact. Patient stable, afebrile. Discharged patient. Discharged patient with Tylenol. Discussed with patient to rest, ice, elevate. Discussed with patient to avoid any physical stress activity. Referred patient to primary care provider and orthopedics. Discussed with patient to closely monitor symptoms and if symptoms are to worsen or change to report back to the ED - strict return instructions given.  Patient agreed to plan of care, understood, all questions answered.   Raymon Mutton, PA-C 12/21/13 1352

## 2013-12-21 NOTE — ED Provider Notes (Signed)
Medical screening examination/treatment/procedure(s) were performed by non-physician practitioner and as supervising physician I was immediately available for consultation/collaboration.  EKG Interpretation   None        Torria Fromer F Keyshon Stein, MD 12/21/13 1922 

## 2014-02-04 ENCOUNTER — Emergency Department (HOSPITAL_COMMUNITY)
Admission: EM | Admit: 2014-02-04 | Discharge: 2014-02-04 | Disposition: A | Discharge status: Home / Self Care | Payer: Medicaid Other | Attending: Emergency Medicine | Admitting: Emergency Medicine

## 2014-02-04 DIAGNOSIS — Z87891 Personal history of nicotine dependence: Secondary | ICD-10-CM | POA: Insufficient documentation

## 2014-02-04 DIAGNOSIS — Z3202 Encounter for pregnancy test, result negative: Secondary | ICD-10-CM | POA: Insufficient documentation

## 2014-02-04 DIAGNOSIS — Z79899 Other long term (current) drug therapy: Secondary | ICD-10-CM | POA: Insufficient documentation

## 2014-02-04 DIAGNOSIS — N390 Urinary tract infection, site not specified: Secondary | ICD-10-CM | POA: Insufficient documentation

## 2014-02-04 DIAGNOSIS — IMO0002 Reserved for concepts with insufficient information to code with codable children: Secondary | ICD-10-CM | POA: Insufficient documentation

## 2014-02-04 DIAGNOSIS — Z8719 Personal history of other diseases of the digestive system: Secondary | ICD-10-CM | POA: Insufficient documentation

## 2014-02-04 LAB — URINALYSIS, ROUTINE W REFLEX MICROSCOPIC
Bilirubin Urine: NEGATIVE
GLUCOSE, UA: NEGATIVE mg/dL
HGB URINE DIPSTICK: NEGATIVE
Ketones, ur: NEGATIVE mg/dL
Nitrite: NEGATIVE
PH: 5.5 (ref 5.0–8.0)
PROTEIN: NEGATIVE mg/dL
SPECIFIC GRAVITY, URINE: 1.016 (ref 1.005–1.030)
Urobilinogen, UA: 0.2 mg/dL (ref 0.0–1.0)

## 2014-02-04 LAB — URINE MICROSCOPIC-ADD ON

## 2014-02-04 LAB — POC URINE PREG, ED: PREG TEST UR: NEGATIVE

## 2014-02-04 MED ORDER — CEPHALEXIN 250 MG PO CAPS
500.0000 mg | ORAL_CAPSULE | Freq: Once | ORAL | Status: AC
  Administered 2014-02-04: 500 mg via ORAL
  Filled 2014-02-04: qty 2

## 2014-02-04 MED ORDER — CEPHALEXIN 250 MG/5ML PO SUSR: 500.0000 mg | Freq: Three times a day (TID) | ORAL | Status: AC

## 2014-02-04 NOTE — ED Notes (Signed)
See downtime charting. 

## 2014-02-04 NOTE — ED Provider Notes (Addendum)
CSN: [id number]     Arrival date & time 02/04/14  0009 History   None    Chief Complaint  Patient presents with  . Vaginal Pain     (Consider location/radiation/quality/duration/timing/severity/associated sxs/prior Treatment) HPI 23 yo woman her with 2 days of burning dysuria. No fever. Notes urinary frequency. Pain mild. No abd or pelvic pain. No unusual discharge. No fever.   The patient has a history of ulcerative colitis but is not currently taking any medications.     Past Medical History  Diagnosis Date  . Ulcerative colitis 05/29/08 & 04/29/10    Colonoscopy with biopsy Dx'd  . Ulcerative colitis    Past Surgical History  Procedure Laterality Date  . Colonoscopy w/ biopsies  05/29/08 and 05/08/10    Biopsy consistent with colitis   Family History  Problem Relation Age of Onset  . Colon polyps Father   . Hypertension Father   . Colon polyps Maternal Grandmother   . Colon polyps Paternal Grandmother   . Inflammatory bowel disease Neg Hx   . Hypertension Mother    History  Substance Use Topics  . Smoking status: Former Smoker -- 0.10 packs/day    Types: Cigarettes  . Smokeless tobacco: Not on file  . Alcohol Use: No   OB History   Grav Para Term Preterm Abortions TAB SAB Ect Mult Living                 Review of Systems Ten point review of symptoms performed and is negative with the exception of symptoms noted above.    Allergies  Review of patient's allergies indicates no known allergies.  Home Medications   Current Outpatient Rx  Name  Route  Sig  Dispense  Refill  . acetaminophen (TYLENOL 8 HOUR) 650 MG CR tablet   Oral   Take 1 tablet (650 mg total) by mouth every 8 (eight) hours as needed for pain.   30 tablet   0   . azaTHIOprine (IMURAN) 50 MG tablet   Oral   Take 50 mg by mouth as needed (for UC flare).         . IRON PO   Oral   Take 1 tablet by mouth daily.         . predniSONE (DELTASONE) 20 MG tablet   Oral   Take 1 tablet (20  mg total) by mouth 2 (two) times daily with a meal.   60 tablet   0    BP 115/75  Pulse 88  Temp(Src) 98.2 F (36.8 C) (Oral)  Resp 18  SpO2 100%  LMP 01/27/2014 Physical Exam  Gen: well developed and well nourished appearing Head: NCAT Eyes: PERL, EOMI Nose: no epistaixis or rhinorrhea Mouth/throat: mucosa is moist and pink Neck: supple, no stridor Lungs: CTA B, no wheezing, rhonchi or rales CV: RRR, no murmur, extremities appear well perfused.  Abd: soft, obese, notender, nondistended Back: no ttp, no cva ttp Skin: warm and dry Ext: normal to inspection, no dependent edema Neuro: CN ii-xii grossly intact, no focal deficits Psyche; normal affect,  calm and cooperative.   ED Course  Procedures (including critical care time) Labs Review Results for orders placed during the hospital encounter of 02/04/14 (from the past 24 hour(s))  POC URINE PREG, ED     Status: None   Collection Time    02/04/14 12:28 AM      Result Value Ref Range   Preg Test, Ur NEGATIVE  NEGATIVE  URINALYSIS,  ROUTINE W REFLEX MICROSCOPIC     Status: Abnormal   Collection Time    02/04/14 12:32 AM      Result Value Ref Range   Color, Urine YELLOW  YELLOW   APPearance CLOUDY (*) CLEAR   Specific Gravity, Urine 1.016  1.005 - 1.030   pH 5.5  5.0 - 8.0   Glucose, UA NEGATIVE  NEGATIVE mg/dL   Hgb urine dipstick NEGATIVE  NEGATIVE   Bilirubin Urine NEGATIVE  NEGATIVE   Ketones, ur NEGATIVE  NEGATIVE mg/dL   Protein, ur NEGATIVE  NEGATIVE mg/dL   Urobilinogen, UA 0.2  0.0 - 1.0 mg/dL   Nitrite NEGATIVE  NEGATIVE   Leukocytes, UA LARGE (*) NEGATIVE  URINE MICROSCOPIC-ADD ON     Status: Abnormal   Collection Time    02/04/14 12:32 AM      Result Value Ref Range   Squamous Epithelial / LPF FEW (*) RARE   WBC, UA 21-50  <3 WBC/hpf   Bacteria, UA RARE  RARE   Urine-Other FEW TRICHOMONAS       MDM   Patient with simple UTI/cystitis. Afebrile, nontoxic appearing. Empiric tx with Keflex. First  dose in ED. Patient given return precautions.     Brandt Loosen, MD 02/04/14 1610  Brandt Loosen, MD 02/09/14 [phone]

## 2014-02-04 NOTE — ED Notes (Signed)
Having burning in the vagina. Burns when urinating. Water makes it better.

## 2014-02-20 ENCOUNTER — Emergency Department (INDEPENDENT_AMBULATORY_CARE_PROVIDER_SITE_OTHER): Payer: Medicaid Other

## 2014-02-20 ENCOUNTER — Other Ambulatory Visit (HOSPITAL_COMMUNITY)
Admission: RE | Admit: 2014-02-20 | Discharge: 2014-02-20 | Disposition: A | Discharge status: Home / Self Care | Payer: Medicaid Other | Source: Ambulatory Visit | Attending: Emergency Medicine | Admitting: Emergency Medicine

## 2014-02-20 ENCOUNTER — Emergency Department (INDEPENDENT_AMBULATORY_CARE_PROVIDER_SITE_OTHER)
Admission: EM | Admit: 2014-02-20 | Discharge: 2014-02-20 | Disposition: A | Discharge status: Home / Self Care | Payer: Medicaid Other | Source: Home / Self Care | Attending: Emergency Medicine | Admitting: Emergency Medicine

## 2014-02-20 ENCOUNTER — Encounter (HOSPITAL_COMMUNITY): Payer: Self-pay | Admitting: Emergency Medicine

## 2014-02-20 DIAGNOSIS — M25539 Pain in unspecified wrist: Secondary | ICD-10-CM

## 2014-02-20 DIAGNOSIS — N898 Other specified noninflammatory disorders of vagina: Secondary | ICD-10-CM

## 2014-02-20 DIAGNOSIS — N760 Acute vaginitis: Secondary | ICD-10-CM | POA: Insufficient documentation

## 2014-02-20 DIAGNOSIS — A5901 Trichomonal vulvovaginitis: Secondary | ICD-10-CM

## 2014-02-20 DIAGNOSIS — M25532 Pain in left wrist: Secondary | ICD-10-CM

## 2014-02-20 DIAGNOSIS — N390 Urinary tract infection, site not specified: Secondary | ICD-10-CM

## 2014-02-20 MED ORDER — CEFTRIAXONE SODIUM 250 MG IJ SOLR
INTRAMUSCULAR | Status: AC
  Filled 2014-02-20: qty 250

## 2014-02-20 MED ORDER — CEFTRIAXONE SODIUM 250 MG IJ SOLR
250.0000 mg | Freq: Once | INTRAMUSCULAR | Status: AC
  Administered 2014-02-20: 250 mg via INTRAMUSCULAR

## 2014-02-20 MED ORDER — AZITHROMYCIN 250 MG PO TABS
1000.0000 mg | ORAL_TABLET | Freq: Once | ORAL | Status: AC
  Administered 2014-02-20: 1000 mg via ORAL

## 2014-02-20 MED ORDER — LIDOCAINE HCL (PF) 1 % IJ SOLN
INTRAMUSCULAR | Status: AC
  Filled 2014-02-20: qty 5

## 2014-02-20 MED ORDER — NITROFURANTOIN MONOHYD MACRO 100 MG PO CAPS: 100.0000 mg | ORAL_CAPSULE | Freq: Two times a day (BID) | ORAL | Status: DC

## 2014-02-20 MED ORDER — METRONIDAZOLE 500 MG PO TABS
ORAL_TABLET | ORAL | Status: AC
  Filled 2014-02-20: qty 4

## 2014-02-20 MED ORDER — METRONIDAZOLE 500 MG PO TABS
2000.0000 mg | ORAL_TABLET | Freq: Once | ORAL | Status: AC
  Administered 2014-02-20: 2000 mg via ORAL

## 2014-02-20 MED ORDER — AZITHROMYCIN 250 MG PO TABS
ORAL_TABLET | ORAL | Status: AC
  Filled 2014-02-20: qty 4

## 2014-02-20 NOTE — Discharge Instructions (Signed)
Please review the instructions below. The xrays today of your left wrist are normal. Use Ibuprofen 800 mg three times a day as needed for pain as well as the wrist splint until pain resolves/improves. Take the antibiotic as directed for the urinary tract infection and be sure to finish. If any other tests return positive (in approx 2 days) requiring further treatment you will be notified by phone. Do not have sex for 10 days and when you resume having sex use condoms.   Sexually Transmitted Disease A sexually transmitted disease (STD) is a disease or infection. It may be passed from person to person. It usually is passed during sex. STDs can be spread by different types of germs. These germs are bacteria, viruses, and parasites. An STD can be passed through:  Spit (saliva).  Semen.  Blood.  Mucus from the vagina.  Pee (urine). HOW CAN I LESSEN MY CHANCES OF GETTING AN STD?  Only use condoms labeled "latex," dental dams, and lubricants that wash away with water (water soluble). Do not use petroleum jelly or oils.  Get shots (vaccines) for HPV and hepatitis.  Avoid risky sex behavior that can break the skin. WHAT SHOULD I DO IF I THINK I HAVE AN STD?  See your doctor.  Tell your sex partner(s) that you have an STD. They should be tested and treated.  Do not have sex until your doctor says it is OK. WHEN SHOULD I GET HELP? Get help if:  You have bad belly (abdominal) pain.  You are a man and have puffiness (swelling) or pain in your testicles.  You are a woman and have puffiness in your vagina. MAKE SURE YOU:  Understand these instructions. Document Released: 11/23/2004 Document Revised: 08/06/2013 Document Reviewed: 04/11/2013 [facility] CenterExitCare Patient Information 2014 [place], [place].  Trichomoniasis Trichomoniasis is an infection, caused by the Trichomonas organism, that affects both women and men. In women, the outer female genitalia and the vagina are affected. In men, the penis  is mainly affected, but the prostate and other reproductive organs can also be involved. Trichomoniasis is a sexually transmitted disease (STD) and is most often passed to another person through sexual contact. The majority of people who get trichomoniasis do so from a sexual encounter and are also at risk for other STDs. CAUSES   Sexual intercourse with an infected partner.  It can be present in swimming pools or hot tubs. SYMPTOMS   Abnormal gray-green frothy vaginal discharge in women.  Vaginal itching and irritation in women.  Itching and irritation of the area outside the vagina in women.  Penile discharge with or without pain in males.  Inflammation of the urethra (urethritis), causing painful urination.  Bleeding after sexual intercourse. RELATED COMPLICATIONS  Pelvic inflammatory disease.  Infection of the uterus (endometritis).  Infertility.  Tubal (ectopic) pregnancy.  It can be associated with other STDs, including gonorrhea and chlamydia, hepatitis B, and HIV. COMPLICATIONS DURING PREGNANCY  Early (premature) delivery.  Premature rupture of the membranes (PROM).  Low birth weight. DIAGNOSIS   Visualization of Trichomonas under the microscope from the vagina discharge.  Ph of the vagina greater than 4.5, tested with a test tape.  Trich Rapid Test.  Culture of the organism, but this is not usually needed.  It may be found on a Pap test.  Having a "strawberry cervix,"which means the cervix looks very red like a strawberry. TREATMENT   You may be given medication to fight the infection. Inform your caregiver if you could be  or are pregnant. Some medications used to treat the infection should not be taken during pregnancy.  Over-the-counter medications or creams to decrease itching or irritation may be recommended.  Your sexual partner will need to be treated if infected. HOME CARE INSTRUCTIONS   Take all medication prescribed by your  caregiver.  Take over-the-counter medication for itching or irritation as directed by your caregiver.  Do not have sexual intercourse while you have the infection.  Do not douche or wear tampons.  Discuss your infection with your partner, as your partner may have acquired the infection from you. Or, your partner may have been the person who transmitted the infection to you.  Have your sex partner examined and treated if necessary.  Practice safe, informed, and protected sex.  See your caregiver for other STD testing. SEEK MEDICAL CARE IF:   You still have symptoms after you finish the medication.  You have an oral temperature above 102 F (38.9 C).  You develop belly (abdominal) pain.  You have pain when you urinate.  You have bleeding after sexual intercourse.  You develop a rash.  The medication makes you sick or makes you throw up (vomit). Document Released: 04/11/2001 Document Revised: 01/08/2012 Document Reviewed: 05/07/2009 [facility]inasExitCare Patient Information 2014 [place]ExitCare, [place]LL[place].  Urinary Tract Infection A urinary tract infection (UTI) can occur any place along the urinary tract. The tract includes the kidneys, ureters, bladder, and urethra. A type of germ called bacteria often causes a UTI. UTIs are often helped with antibiotic medicine.  HOME CARE   If given, take antibiotics as told by your doctor. Finish them even if you start to feel better.  Drink enough fluids to keep your pee (urine) clear or pale yellow.  Avoid tea, drinks with caffeine, and bubbly (carbonated) drinks.  Pee often. Avoid holding your pee in for a long time.  Pee before and after having sex (intercourse).  Wipe from front to back after you poop (bowel movement) if you are a woman. Use each tissue only once. GET HELP RIGHT AWAY IF:   You have back pain.  You have lower belly (abdominal) pain.  You have chills.  You feel sick to your stomach (nauseous).  You throw up (vomit).  Your  burning or discomfort with peeing does not go away.  You have a fever.  Your symptoms are not better in 3 days. MAKE SURE YOU:   Understand these instructions.  Will watch your condition.  Will get help right away if you are not doing well or get worse. Document Released: 04/03/2008 Document Revised: 07/10/2012 Document Reviewed: 05/16/2012 [facility]Exit[facility] Patient Information 2014 [place], [place].  Wrist Pain A wrist sprain happens when the bands of tissue that hold the wrist joints together (ligament) stretch too much or tear. A wrist strain happens when muscles or bands of tissue that connect muscles to bones (tendons) are stretched or pulled. HOME CARE  Put ice on the injured area.  Put ice in a plastic bag.  Place a towel between your skin and the bag.  Leave the ice on for 15-20 minutes, 03-04 times a day, for the first 2 days.  Raise (elevate) the injured wrist to lessen puffiness (swelling).  Rest the injured wrist for at least 48 hours or as told by your doctor.  Wear a splint, cast, or an elastic wrap as told by your doctor.  Only take medicine as told by your doctor.  Follow up with your doctor as told. This is important. GET  HELP RIGHT AWAY IF:   The fingers are puffy, very red, white, or cold and blue.  The fingers lose feeling (numb) or tingle.  The pain gets worse.  It is hard to move the fingers. MAKE SURE YOU:   Understand these instructions.  Will watch your condition.  Will get help right away if you are not doing well or get worse. Document Released: 04/03/2008 Document Revised: 01/08/2012 Document Reviewed: 12/07/2010 [facility] Patient Information 2014 [place], [place].

## 2014-02-20 NOTE — ED Notes (Signed)
Reports having pain/swelling in left wrist off/on.    Pain felt in wrist with bending.  Denies injury.  Mild relief with aleve.     Pt requesting rx for yeast.  Recently finished antibiotic for uti.

## 2014-02-23 LAB — CERVICOVAGINAL ANCILLARY ONLY
Wet Prep (BD Affirm): NEGATIVE
Wet Prep (BD Affirm): POSITIVE — AB
Wet Prep (BD Affirm): POSITIVE — AB

## 2014-02-23 NOTE — ED Provider Notes (Signed)
CSN: [id number]     Arrival date & time 02/20/14  1917 History   First MD Initiated Contact with Patient 02/20/14 2039     Chief Complaint  Patient presents with  . Wrist Pain  . Vaginitis    Patient is a 23 y.o. female presenting with wrist pain. The history is provided by the patient.  Wrist Pain This is a recurrent problem. The current episode started more than 1 week ago. The problem has not changed since onset.Pertinent negatives include no chest pain, no abdominal pain, no headaches and no shortness of breath. Nothing relieves the symptoms.  Pain is intermittent, worsens with movement and is not relieved by splinting or OTC medications. Pt denies injury to the (L) wrist, states that it first happened in 08/2013. The wrist swells and becomes painful to move. She takes Advil when these episodes occur which helps "a little". She has a wrist splint that she wears but this does not seem to improve pain either. Pain and swelling resolve after a week or so. Denies swelling or pain to other joints.  Dysuria Pt reports that she was seen in ED approx 2 weeks ago for UTI symptoms. She was told she had a UTI and was placed on Keflex. Pt states that she finished the medication but is still having burning w/ urination. Pt has also noticed increased vaginal d/c. Denies fever or abd pain. Pt is a G0, not on birth control, sexually active w/ one partner.   Past Medical History  Diagnosis Date  . Ulcerative colitis 05/29/08 & 04/29/10    Colonoscopy with biopsy Dx'd  . Ulcerative colitis    Past Surgical History  Procedure Laterality Date  . Colonoscopy w/ biopsies  05/29/08 and 05/08/10    Biopsy consistent with colitis   Family History  Problem Relation Age of Onset  . Colon polyps Father   . Hypertension Father   . Colon polyps Maternal Grandmother   . Colon polyps Paternal Grandmother   . Inflammatory bowel disease Neg Hx   . Hypertension Mother    History  Substance Use Topics  . Smoking  status: Former Smoker -- 0.10 packs/day    Types: Cigarettes  . Smokeless tobacco: Not on file  . Alcohol Use: No   OB History   Grav Para Term Preterm Abortions TAB SAB Ect Mult Living                 Review of Systems  Constitutional: Negative for fever and chills.  HENT: Negative.   Eyes: Negative.   Respiratory: Negative.  Negative for shortness of breath.   Cardiovascular: Negative.  Negative for chest pain.  Gastrointestinal: Negative.  Negative for abdominal pain.  Endocrine: Negative.   Genitourinary: Positive for dysuria.  Musculoskeletal: Positive for joint swelling.  Allergic/Immunologic: Negative.   Neurological: Negative.  Negative for headaches.  Hematological: Negative.   Psychiatric/Behavioral: Negative.     Allergies  Review of patient's allergies indicates no known allergies.  Home Medications   Prior to Admission medications   Medication Sig Start Date End Date Taking? Authorizing Provider  acetaminophen (TYLENOL 8 HOUR) 650 MG CR tablet Take 1 tablet (650 mg total) by mouth every 8 (eight) hours as needed for pain. 12/20/13   Marissa Sciacca, PA-C  azaTHIOprine (IMURAN) 50 MG tablet Take 50 mg by mouth as needed (for UC flare).    Historical Provider, MD  IRON PO Take 1 tablet by mouth daily.    Historical Provider, MD  nitrofurantoin, macrocrystal-monohydrate, (MACROBID) 100 MG capsule Take 1 capsule (100 mg total) by mouth 2 (two) times daily. For seven days 02/20/14   Leanne ChangKatherine P Teniyah Seivert, NP  predniSONE (DELTASONE) 20 MG tablet Take 1 tablet (20 mg total) by mouth 2 (two) times daily with a meal. 10/22/13   Rodolph BongEvan S Corey, MD   BP 115/75  Pulse 75  Temp(Src) 98.7 F (37.1 C) (Oral)  Resp 16  SpO2 100%  LMP 02/01/2014 Physical Exam  Nursing note and vitals reviewed. Constitutional: She is oriented to person, place, and time. She appears well-developed and well-nourished.  HENT:  Head: Normocephalic and atraumatic.  Eyes: Conjunctivae are normal.   Cardiovascular: Normal rate.   Pulmonary/Chest: Effort normal.  Abdominal: Soft. There is no tenderness.  Genitourinary: There is no rash, tenderness, lesion or injury on the right labia. There is no rash, tenderness, lesion or injury on the left labia. Vaginal discharge found.  Unable to completely assess as pt unable to tolerate exam.   Neurological: She is alert and oriented to person, place, and time.  Skin: Skin is warm and dry.  Psychiatric: She has a normal mood and affect.    ED Course  Pelvic exam Date/Time: 02/20/2014 9:00 PM Performed by: Leanne ChangSCHORR, Lucillie Kiesel P Authorized by: Leanne ChangSCHORR, Domanique Huesman P Consent: Verbal consent obtained. Risks and benefits: risks, benefits and alternatives were discussed Consent given by: patient Patient consent: the patient's understanding of the procedure matches consent given Required items: required blood products, implants, devices, and special equipment available Patient identity confirmed: verbally with patient and arm band Local anesthesia used: no Patient sedated: no Comments: Pt unable to tolerate exam. States she has never had a pelvic exam before. Smaller pediatric speculum attempted but pt still unable to cooperate w/ exam.   (including critical care time) Labs Review Labs Reviewed  CERVICOVAGINAL ANCILLARY ONLY    Imaging Review No results found.   MDM   1. Trichomonas vaginitis   2. UTI (lower urinary tract infection)   3. Vaginal discharge   4. Wrist pain, left    1-3:  Records from ED revealed u/a on 02/04/14 was also positive for Trich but pt not treated. Given Flagyl 2 Gms today PO. Will treat UTI w/ Macrobid 100 mg BID x 7 days as pt is still symptomatic. Unable to tolerate pelvic exam (states she has never had a pelvic) but exam was notable for yellowish-green, maloderous vag d/c. Affirm vag specimen sent but was unable to collect endocervical specimens. Given these circumstances I treated pt empirically w/ Rocephin and  Zithromax. Pt referred back to her PCP for f/u as needed.  4. (L) wrist xray is neg for any acute process. Velcro wrist splint applied. Pt encouraged to use NSAIDS for pain and to arrange ortho f/u. Referral provided. Pt verbalizes understanding and is agreeable w/ plan.     Leanne ChangKatherine P Reannon Candella, NP 02/23/14 [phone]

## 2014-02-23 NOTE — ED Provider Notes (Signed)
Medical screening examination/treatment/procedure(s) were performed by non-physician practitioner and as supervising physician I was immediately available for consultation/collaboration.  Leslee Homeavid Ellyssa Zagal, M.D.   Reuben Likesavid C London Nonaka, MD 02/23/14 [phone]

## 2014-02-25 ENCOUNTER — Telehealth (HOSPITAL_COMMUNITY): Payer: Self-pay | Admitting: Emergency Medicine

## 2014-02-25 ENCOUNTER — Telehealth (HOSPITAL_COMMUNITY): Payer: Self-pay | Admitting: *Deleted

## 2014-02-25 MED ORDER — METRONIDAZOLE 500 MG PO TABS: 500.0000 mg | ORAL_TABLET | Freq: Two times a day (BID) | ORAL | Status: DC

## 2014-02-25 NOTE — ED Notes (Signed)
I called pt. Pt. verified x 2 and given results.  Pt. told she needs Flagyl for bacterial vaginosis and Trich.  Pt. said she got the medicine for Trich while she is here.  I told her I could see that on her record now, so the medicine is for the bacterial vaginosis.   Pt. instructed to no alcohol while taking this medication.  Pt. instructed to notify her partner to be treated with Flagyl, no sex until she has finished her medication and her partner has been treated and to practice safe sex.  Pt. states she is has never had sex and she got it from a hot tub.  [facility] 02/25/2014

## 2014-02-25 NOTE — ED Notes (Signed)
The patient's DNA probe came back positive for Gardnerella and Trichomonas. She will need metronidazole 500 mg, #14, 1 twice a day for one week. This will be sent to her [facility]. We will need to call her and let her know these results.   Reuben Likesavid C Stephen Baruch, MD 02/25/14 1356

## 2014-02-25 NOTE — Telephone Encounter (Signed)
Message copied by Reuben LikesKELLER, Beatrice Sehgal C on Wed Feb 25, 2014  1:55 PM ------      Message from: Vassie MoselleYORK, SUZANNE M      Created: Wed Feb 25, 2014 12:24 AM      Regarding: labs       Gardnerella and Trich pos.  Needs treatment.      Desiree LucySuzanne M York      02/25/2014       ------

## 2014-03-29 ENCOUNTER — Emergency Department (HOSPITAL_COMMUNITY)
Admission: EM | Admit: 2014-03-29 | Discharge: 2014-03-29 | Disposition: A | Discharge status: Home / Self Care | Payer: Medicaid Other | Attending: Emergency Medicine | Admitting: Emergency Medicine

## 2014-03-29 ENCOUNTER — Encounter (HOSPITAL_COMMUNITY): Payer: Self-pay | Admitting: Emergency Medicine

## 2014-03-29 DIAGNOSIS — Z79899 Other long term (current) drug therapy: Secondary | ICD-10-CM | POA: Insufficient documentation

## 2014-03-29 DIAGNOSIS — G43909 Migraine, unspecified, not intractable, without status migrainosus: Secondary | ICD-10-CM | POA: Insufficient documentation

## 2014-03-29 DIAGNOSIS — IMO0002 Reserved for concepts with insufficient information to code with codable children: Secondary | ICD-10-CM | POA: Insufficient documentation

## 2014-03-29 DIAGNOSIS — E669 Obesity, unspecified: Secondary | ICD-10-CM | POA: Insufficient documentation

## 2014-03-29 DIAGNOSIS — Z87891 Personal history of nicotine dependence: Secondary | ICD-10-CM | POA: Insufficient documentation

## 2014-03-29 DIAGNOSIS — Z8719 Personal history of other diseases of the digestive system: Secondary | ICD-10-CM | POA: Insufficient documentation

## 2014-03-29 MED ORDER — ONDANSETRON 4 MG PO TBDP
4.0000 mg | ORAL_TABLET | Freq: Once | ORAL | Status: AC
  Administered 2014-03-29: 4 mg via ORAL
  Filled 2014-03-29: qty 1

## 2014-03-29 MED ORDER — KETOROLAC TROMETHAMINE 60 MG/2ML IM SOLN
60.0000 mg | Freq: Once | INTRAMUSCULAR | Status: AC
  Administered 2014-03-29: 60 mg via INTRAMUSCULAR
  Filled 2014-03-29: qty 2

## 2014-03-29 NOTE — ED Provider Notes (Signed)
CSN: [id number]     Arrival date & time 03/29/14  1253 History   First MD Initiated Contact with Patient 03/29/14 1301     Chief Complaint  Patient presents with  . Migraine     (Consider location/radiation/quality/duration/timing/severity/associated sxs/prior Treatment) HPI  This is a 23 year old female who presents with a headache. Patient reports that last night she had onset of frontal headache. He got little bit better overnight but then worsen. Currently her pain is 8/10. It is not better with Tylenol. She denies any vision changes, weakness, numbness or tingling. She denies any recent fevers or neck stiffness. Patient does report history of "one or 2 migraines in the past." She reports nausea without vomiting. He endorses photophobia.  Past Medical History  Diagnosis Date  . Ulcerative colitis 05/29/08 & 04/29/10    Colonoscopy with biopsy Dx'd  . Ulcerative colitis    Past Surgical History  Procedure Laterality Date  . Colonoscopy w/ biopsies  05/29/08 and 05/08/10    Biopsy consistent with colitis   Family History  Problem Relation Age of Onset  . Colon polyps Father   . Hypertension Father   . Colon polyps Maternal Grandmother   . Colon polyps Paternal Grandmother   . Inflammatory bowel disease Neg Hx   . Hypertension Mother    History  Substance Use Topics  . Smoking status: Former Smoker -- 0.10 packs/day    Types: Cigarettes  . Smokeless tobacco: Not on file  . Alcohol Use: No   OB History   Grav Para Term Preterm Abortions TAB SAB Ect Mult Living                 Review of Systems  Constitutional: Negative for fever.  Respiratory: Negative for chest tightness and shortness of breath.   Cardiovascular: Negative for chest pain.  Gastrointestinal: Positive for nausea. Negative for vomiting.  Musculoskeletal: Negative for neck pain.  Skin: Negative for rash.  Neurological: Positive for headaches. Negative for dizziness, weakness and numbness.   Psychiatric/Behavioral: Negative for confusion.  All other systems reviewed and are negative.     Allergies  Review of patient's allergies indicates no known allergies.  Home Medications   Prior to Admission medications   Medication Sig Start Date End Date Taking? Authorizing Provider  acetaminophen (TYLENOL) 500 MG tablet Take 1,000 mg by mouth daily as needed for mild pain.   Yes Historical Provider, MD  azaTHIOprine (IMURAN) 50 MG tablet Take 50 mg by mouth daily as needed (for UC flare).    Yes Historical Provider, MD  ferrous sulfate 325 (65 FE) MG tablet Take 325 mg by mouth 2 (two) times daily with a meal.   Yes Historical Provider, MD  predniSONE (DELTASONE) 20 MG tablet Take 20 mg by mouth 2 (two) times daily as needed (for UC flare).   Yes Historical Provider, MD   BP 110/64  Pulse 66  Temp(Src) 97.9 F (36.6 C) (Oral)  Resp 18  Wt 257 lb 4.8 oz (116.711 kg)  SpO2 100%  LMP 03/21/2014 Physical Exam  Nursing note and vitals reviewed. Constitutional: She is oriented to person, place, and time. She appears well-developed and well-nourished. No distress.  Obese  HENT:  Head: Normocephalic and atraumatic.  Eyes: EOM are normal. Pupils are equal, round, and reactive to light.  Neck: Neck supple.  Full range of motion, no meningismus noted  Cardiovascular: Normal rate, regular rhythm and normal heart sounds.   Pulmonary/Chest: Effort normal. No respiratory distress.  Abdominal:  Soft. There is no tenderness.  Musculoskeletal: She exhibits no edema.  Neurological: She is alert and oriented to person, place, and time.  Cranial nerves II through XII intact, 5 out of 5 strength in all 4 extremities  Skin: Skin is warm and dry. No rash noted.  Psychiatric: She has a normal mood and affect.    ED Course  Procedures (including critical care time) Labs Review Labs Reviewed - No data to display  Imaging Review No results found.   EKG Interpretation None      Medications  ketorolac (TORADOL) injection 60 mg (60 mg Intramuscular Given 03/29/14 1344)  ondansetron (ZOFRAN-ODT) disintegrating tablet 4 mg (4 mg Oral Given 03/29/14 1344)    MDM   Final diagnoses:  Migraine    Patient presents with headache. She is nontoxic and nonfocal on exam. No signs of infection. Denies worst headache of her life. Patient was given IM Toradol and Zofran ODT. On recheck, patient reports improvement of symptoms. Pain is not down to 0 point is improved. She is resting comfortably.  Discuss with patient that pain will likely continue to improve. Given that she is nonfocal and have low suspicion for pathological for headache, the patient can be safely discharged home. Patient to continue Tylenol as she does not tolerate NSAIDs given her ulcerative colitis.  After history, exam, and medical workup I feel the patient has been appropriately medically screened and is safe for discharge home. Pertinent diagnoses were discussed with the patient. Patient was given return precautions.     Shon Batonourtney F Cordarro Spinnato, MD 03/30/14 [phone]

## 2014-03-29 NOTE — Discharge Instructions (Signed)
Migraine Headache A migraine headache is an intense, throbbing pain on one or both sides of your head. A migraine can last for 30 minutes to several hours. CAUSES  The exact cause of a migraine headache is not always known. However, a migraine may be caused when nerves in the brain become irritated and release chemicals that cause inflammation. This causes pain. Certain things may also trigger migraines, such as:  Alcohol.  Smoking.  Stress.  Menstruation.  Aged cheeses.  Foods or drinks that contain nitrates, glutamate, aspartame, or tyramine.  Lack of sleep.  Chocolate.  Caffeine.  Hunger.  Physical exertion.  Fatigue.  Medicines used to treat chest pain (nitroglycerine), birth control pills, estrogen, and some blood pressure medicines. SIGNS AND SYMPTOMS  Pain on one or both sides of your head.  Pulsating or throbbing pain.  Severe pain that prevents daily activities.  Pain that is aggravated by any physical activity.  Nausea, vomiting, or both.  Dizziness.  Pain with exposure to bright lights, loud noises, or activity.  General sensitivity to bright lights, loud noises, or smells. Before you get a migraine, you may get warning signs that a migraine is coming (aura). An aura may include:  Seeing flashing lights.  Seeing bright spots, halos, or zig-zag lines.  Having tunnel vision or blurred vision.  Having feelings of numbness or tingling.  Having trouble talking.  Having muscle weakness. DIAGNOSIS  A migraine headache is often diagnosed based on:  Symptoms.  Physical exam.  A CT scan or MRI of your head. These imaging tests cannot diagnose migraines, but they can help rule out other causes of headaches. TREATMENT Medicines may be given for pain and nausea. Medicines can also be given to help prevent recurrent migraines.  HOME CARE INSTRUCTIONS  Only take over-the-counter or prescription medicines for pain or discomfort as directed by your  health care provider. The use of long-term narcotics is not recommended.  Lie down in a dark, quiet room when you have a migraine.  Keep a journal to find out what may trigger your migraine headaches. For example, write down:  What you eat and drink.  How much sleep you get.  Any change to your diet or medicines.  Limit alcohol consumption.  Quit smoking if you smoke.  Get 7 9 hours of sleep, or as recommended by your health care provider.  Limit stress.  Keep lights dim if bright lights bother you and make your migraines worse. SEEK IMMEDIATE MEDICAL CARE IF:   Your migraine becomes severe.  You have a fever.  You have a stiff neck.  You have vision loss.  You have muscular weakness or loss of muscle control.  You start losing your balance or have trouble walking.  You feel faint or pass out.  You have severe symptoms that are different from your first symptoms. MAKE SURE YOU:   Understand these instructions.  Will watch your condition.  Will get help right away if you are not doing well or get worse. Document Released: 10/16/2005 Document Revised: 08/06/2013 Document Reviewed: 06/23/2013 [employer] Patient Information 2014 [employer].  

## 2014-03-29 NOTE — ED Notes (Signed)
Pt. Stated, I started having a migraine last night and it slacked off and then it came back again today.  I took some Tylenol but it didn't work

## 2014-03-29 NOTE — ED Notes (Signed)
Pt. oob gait steady, drinking gingerale.

## 2014-03-31 ENCOUNTER — Encounter (HOSPITAL_COMMUNITY): Payer: Self-pay | Admitting: Emergency Medicine

## 2014-03-31 ENCOUNTER — Emergency Department (HOSPITAL_COMMUNITY)
Admission: EM | Admit: 2014-03-31 | Discharge: 2014-03-31 | Disposition: A | Discharge status: Home / Self Care | Payer: Medicaid Other | Attending: Emergency Medicine | Admitting: Emergency Medicine

## 2014-03-31 DIAGNOSIS — J029 Acute pharyngitis, unspecified: Secondary | ICD-10-CM

## 2014-03-31 DIAGNOSIS — Z87891 Personal history of nicotine dependence: Secondary | ICD-10-CM | POA: Insufficient documentation

## 2014-03-31 DIAGNOSIS — K5190 Ulcerative colitis, unspecified, without complications: Secondary | ICD-10-CM | POA: Insufficient documentation

## 2014-03-31 DIAGNOSIS — J310 Chronic rhinitis: Secondary | ICD-10-CM

## 2014-03-31 DIAGNOSIS — IMO0002 Reserved for concepts with insufficient information to code with codable children: Secondary | ICD-10-CM | POA: Insufficient documentation

## 2014-03-31 DIAGNOSIS — Z79899 Other long term (current) drug therapy: Secondary | ICD-10-CM | POA: Insufficient documentation

## 2014-03-31 MED ORDER — PSEUDOEPHEDRINE HCL 60 MG PO TABS: 60.0000 mg | ORAL_TABLET | Freq: Four times a day (QID) | ORAL | Status: DC | PRN

## 2014-03-31 NOTE — ED Notes (Signed)
Pt is here with sore throat for one week, no fever.  No headache or ear pain

## 2014-03-31 NOTE — ED Provider Notes (Signed)
Medical screening examination/treatment/procedure(s) were performed by non-physician practitioner and as supervising physician I was immediately available for consultation/collaboration.   EKG Interpretation None        Gilda Crease, MD 03/31/14 2059

## 2014-03-31 NOTE — Discharge Instructions (Signed)
Please read and follow all provided instructions.  Your diagnoses today include:  1. Rhinitis   2. Sore throat    Tests performed today include:  Vital signs. See below for your results today.   Medications prescribed:   Pseudoephedrine - medication for congestion  Take any prescribed medications only as directed. Treatment for your infection is aimed at treating the symptoms. There are no medications, such as antibiotics, that will cure your infection.   Home care instructions:  Follow any educational materials contained in this packet.   Please continue drinking plenty of fluids.  Use over-the-counter medicines as needed as directed on packaging for symptom relief.  You may also use ibuprofen or tylenol as directed on packaging for pain or fever.  Do not take multiple medicines containing Tylenol or acetaminophen to avoid taking too much of this medication.  Follow-up instructions: Please follow-up with your primary care provider in the next 3 days for further evaluation of your symptoms if you are not feeling better. If you do not have a primary care doctor -- see below for referral information.   Return instructions:   Please return to the Emergency Department if you experience worsening symptoms.   RETURN IMMEDIATELY IF you develop shortness of breath, confusion or altered mental status, a new rash, become dizzy, faint, or poorly responsive, or are unable to be cared for at home.  Please return if you have persistent vomiting and cannot keep down fluids or develop a fever that is not controlled by tylenol or motrin.    Please return if you have any other emergent concerns.  Additional Information:  Your vital signs today were: BP 131/86   Pulse 79   Temp(Src) 99.2 F (37.3 C) (Oral)   Resp 18   Wt 258 lb 3 oz (117.113 kg)   SpO2 100%   LMP 03/21/2014 If your blood pressure (BP) was elevated above 135/85 this visit, please have this repeated by your doctor within one  month. --------------

## 2014-03-31 NOTE — ED Provider Notes (Signed)
CSN: [id number]     Arrival date & time 03/31/14  1818 History  This chart was scribed for non-physician practitioner, Renne CriglerJoshua Jerlean Peralta, PA-C, working with Gilda Creasehristopher J. Pollina, MD by Shari HeritageAisha Amuda, ED Scribe. This patient was seen in room TR07C/TR07C and the patient's care was started at 6:53 PM.   Chief Complaint  Patient presents with  . Sore Throat    The history is provided by the patient. No language interpreter was used.    HPI Comments: Alexandra Ramsey is a 23 y.o. female who presents to the Emergency Department complaining of intermittent sore throat for the past 1 week. She has also noticed some "lumps" on the back of her throat. There is associated cough, postnasal drip, congestion and rhinorrhea. Patient has seasonal allergies and sometimes experiences throat pain with these flares. She has not taken any medications for pain relief, but she has taken Zyrtec today. She denies associated fever, headache or ear pain. She is not having any reflux symptoms. Patient has a medical history of ulcerative colitis.   Past Medical History  Diagnosis Date  . Ulcerative colitis 05/29/08 & 04/29/10    Colonoscopy with biopsy Dx'd  . Ulcerative colitis    Past Surgical History  Procedure Laterality Date  . Colonoscopy w/ biopsies  05/29/08 and 05/08/10    Biopsy consistent with colitis   Family History  Problem Relation Age of Onset  . Colon polyps Father   . Hypertension Father   . Colon polyps Maternal Grandmother   . Colon polyps Paternal Grandmother   . Inflammatory bowel disease Neg Hx   . Hypertension Mother    History  Substance Use Topics  . Smoking status: Former Smoker -- 0.10 packs/day    Types: Cigarettes  . Smokeless tobacco: Not on file  . Alcohol Use: No   OB History   Grav Para Term Preterm Abortions TAB SAB Ect Mult Living                 Review of Systems  Constitutional: Negative for fever and chills.  HENT: Positive for congestion, postnasal drip, rhinorrhea and  sore throat. Negative for ear pain.   Respiratory: Positive for cough.   Gastrointestinal: Negative for nausea, vomiting and diarrhea.  Neurological: Negative for headaches.    Allergies  Review of patient's allergies indicates no known allergies.  Home Medications   Prior to Admission medications   Medication Sig Start Date End Date Taking? Authorizing Provider  acetaminophen (TYLENOL) 500 MG tablet Take 1,000 mg by mouth daily as needed for mild pain.    Historical Provider, MD  azaTHIOprine (IMURAN) 50 MG tablet Take 50 mg by mouth daily as needed (for UC flare).     Historical Provider, MD  ferrous sulfate 325 (65 FE) MG tablet Take 325 mg by mouth 2 (two) times daily with a meal.    Historical Provider, MD  predniSONE (DELTASONE) 20 MG tablet Take 20 mg by mouth 2 (two) times daily as needed (for UC flare).    Historical Provider, MD   Physical Exam  Nursing note and vitals reviewed. Constitutional: She appears well-developed and well-nourished. No distress.  HENT:  Head: Normocephalic and atraumatic.  Right Ear: Tympanic membrane, external ear and ear canal normal.  Left Ear: Tympanic membrane, external ear and ear canal normal.  Nose: Mucosal edema present.  Mouth/Throat: Uvula is midline and oropharynx is clear and moist. No oropharyngeal exudate, posterior oropharyngeal edema or posterior oropharyngeal erythema.  Mild mucosal edema.  Eyes: Conjunctivae and EOM are normal.  Neck: Normal range of motion. No tracheal deviation present.  Cardiovascular: Normal rate.   Pulmonary/Chest: Effort normal. No respiratory distress.  Musculoskeletal: Normal range of motion.  Neurological: She is alert.  Skin: Skin is warm and dry.  Psychiatric: She has a normal mood and affect. Her behavior is normal.    ED Course  Procedures (including critical care time) DIAGNOSTIC STUDIES: Oxygen Saturation is 100% on room air, normal by my interpretation.    COORDINATION OF CARE: 7:13 PM-  Patient informed of current plan for treatment and evaluation and agrees with plan at this time.  BP 131/86  Pulse 79  Temp(Src) 99.2 F (37.3 C) (Oral)  Resp 18  Wt 258 lb 3 oz (117.113 kg)  SpO2 100%  LMP 03/21/2014    Labs Review Labs Reviewed - No data to display  Imaging Review No results found.   EKG Interpretation None      Tx with decongestant -- pt to continue zyrtec at home.   Patient urged to return with worsening symptoms or other concerns. Patient verbalized understanding and agrees with plan.    MDM   Final diagnoses:  Rhinitis  Sore throat   Patient with rhinitis, seasonal allergy symptoms, sore throat x1 week. Exam is benign and there is no evidence of pharyngitis, abscess. Suspect postnasal drip as source of sore throat symptoms. Will focus on treating congestion to help improve sore throat. Patient appears well, nontoxic. No further workup or testing L. indicated at this time  I personally performed the services described in this documentation, which was scribed in my presence. The recorded information has been reviewed and is accurate.    Renne Crigler, PA-C 03/31/14 1946

## 2014-10-17 ENCOUNTER — Encounter (HOSPITAL_COMMUNITY): Payer: Self-pay | Admitting: *Deleted

## 2014-10-17 ENCOUNTER — Emergency Department (HOSPITAL_COMMUNITY)
Admission: EM | Admit: 2014-10-17 | Discharge: 2014-10-17 | Disposition: A | Discharge status: Home / Self Care | Payer: Medicaid Other | Attending: Emergency Medicine | Admitting: Emergency Medicine

## 2014-10-17 DIAGNOSIS — R51 Headache: Secondary | ICD-10-CM | POA: Insufficient documentation

## 2014-10-17 DIAGNOSIS — Z8719 Personal history of other diseases of the digestive system: Secondary | ICD-10-CM | POA: Insufficient documentation

## 2014-10-17 DIAGNOSIS — R519 Headache, unspecified: Secondary | ICD-10-CM

## 2014-10-17 DIAGNOSIS — Z720 Tobacco use: Secondary | ICD-10-CM | POA: Insufficient documentation

## 2014-10-17 MED ORDER — ACETAMINOPHEN 500 MG PO TABS
1000.0000 mg | ORAL_TABLET | Freq: Once | ORAL | Status: AC
  Administered 2014-10-17: 1000 mg via ORAL
  Filled 2014-10-17: qty 2

## 2014-10-17 NOTE — ED Provider Notes (Signed)
CSN: [id number]     Arrival date & time 10/17/14  1924 History   First MD Initiated Contact with Patient 10/17/14 2030     Chief Complaint  Patient presents with  . Headache     (Consider location/radiation/quality/duration/timing/severity/associated sxs/prior Treatment) Patient is a 23 y.o. female presenting with headaches. The history is provided by the patient. No language interpreter was used.  Headache Pain location:  L temporal and R temporal Quality:  Sharp Onset quality:  Gradual Duration:  1 week Timing:  Constant Progression:  Unchanged Similar to prior headaches: yes   Associated symptoms: no congestion, no fever, no nausea, no numbness, no photophobia, no sinus pressure and no vomiting   Associated symptoms comment:  Headache for the past one week. No nausea, vomiting, fever, congestion or sinus pressure. She has tried taking BC powders without relief. No change in activity.    Past Medical History  Diagnosis Date  . Ulcerative colitis 05/29/08 & 04/29/10    Colonoscopy with biopsy Dx'd  . Ulcerative colitis    Past Surgical History  Procedure Laterality Date  . Colonoscopy w/ biopsies  05/29/08 and 05/08/10    Biopsy consistent with colitis   Family History  Problem Relation Age of Onset  . Colon polyps Father   . Hypertension Father   . Colon polyps Maternal Grandmother   . Colon polyps Paternal Grandmother   . Inflammatory bowel disease Neg Hx   . Hypertension Mother    History  Substance Use Topics  . Smoking status: Current Every Day Smoker -- 0.10 packs/day    Types: Cigarettes  . Smokeless tobacco: Never Used  . Alcohol Use: Yes     Comment: occasionally   OB History    No data available     Review of Systems  Constitutional: Negative for fever and chills.  HENT: Negative for congestion and sinus pressure.   Eyes: Negative.  Negative for photophobia and visual disturbance.  Respiratory: Negative.   Cardiovascular: Negative.    Gastrointestinal: Negative.  Negative for nausea and vomiting.  Genitourinary: Negative.  Negative for dysuria and menstrual problem.  Musculoskeletal: Negative.   Skin: Negative.   Neurological: Positive for headaches. Negative for syncope, weakness and numbness.      Allergies  Ibuprofen  Home Medications   Prior to Admission medications   Medication Sig Start Date End Date Taking? Authorizing Provider  Aspirin-Salicylamide-Caffeine (BC HEADACHE PO) Take 1 each by mouth as needed (headache).   Yes Historical Provider, MD  pseudoephedrine (SUDAFED) 60 MG tablet Take 1 tablet (60 mg total) by mouth every 6 (six) hours as needed for congestion. Patient not taking: Reported on 10/17/2014 03/31/14   Renne CriglerJoshua Geiple, PA-C   BP 112/57 mmHg  Pulse 86  Temp(Src) 97.9 F (36.6 C)  Resp 16  Ht 5\' 5"  (1.651 m)  Wt 255 lb (115.667 kg)  BMI 42.43 kg/m2  SpO2 100%  LMP 10/06/2014 Physical Exam  Constitutional: She is oriented to person, place, and time. She appears well-developed and well-nourished.  HENT:  Head: Normocephalic.  Eyes: Pupils are equal, round, and reactive to light.  Neck: Normal range of motion. Neck supple.  Cardiovascular: Normal rate and regular rhythm.   Pulmonary/Chest: Effort normal and breath sounds normal.  Abdominal: Soft. Bowel sounds are normal. There is no tenderness. There is no rebound and no guarding.  Musculoskeletal: Normal range of motion.  Neurological: She is alert and oriented to person, place, and time. She has normal strength and normal reflexes.  No sensory deficit. She displays a negative Romberg sign. Coordination normal.  CN's 3-12 grossly intact. No deficits of coordination - finger-to-nose, heel-to-shin intact. Ambulatory without imbalance.   Skin: Skin is warm and dry. No rash noted.  Psychiatric: She has a normal mood and affect.    ED Course  Procedures (including critical care time) Labs Review Labs Reviewed - No data to  display  Imaging Review No results found.   EKG Interpretation None      MDM   Final diagnoses:  None    1. Nonspecific headache  She is neurologically intact without deficits after headache for one week. Do not suspect intracranial origin. She is appropriate for discharge with supportive care measures and PCP follow up.    Arnoldo HookerShari A Denicia Pagliarulo, PA-C 10/17/14 2059  Purvis SheffieldForrest Harrison, MD 10/18/14 [phone]

## 2014-10-17 NOTE — ED Notes (Signed)
Patient states she stated with a regular headache no Monday btu it has been getting worse.  +photophopbia and sometimes feels like she is not waking right

## 2014-10-17 NOTE — Discharge Instructions (Signed)

## 2014-11-28 ENCOUNTER — Encounter (HOSPITAL_COMMUNITY): Payer: Self-pay | Admitting: *Deleted

## 2014-11-28 ENCOUNTER — Emergency Department (HOSPITAL_COMMUNITY)
Admission: EM | Admit: 2014-11-28 | Discharge: 2014-11-28 | Disposition: A | Discharge status: Home / Self Care | Payer: Medicaid Other | Attending: Emergency Medicine | Admitting: Emergency Medicine

## 2014-11-28 DIAGNOSIS — Z8719 Personal history of other diseases of the digestive system: Secondary | ICD-10-CM | POA: Insufficient documentation

## 2014-11-28 DIAGNOSIS — R21 Rash and other nonspecific skin eruption: Secondary | ICD-10-CM | POA: Insufficient documentation

## 2014-11-28 DIAGNOSIS — R5383 Other fatigue: Secondary | ICD-10-CM | POA: Insufficient documentation

## 2014-11-28 DIAGNOSIS — Z720 Tobacco use: Secondary | ICD-10-CM | POA: Insufficient documentation

## 2014-11-28 DIAGNOSIS — M791 Myalgia: Secondary | ICD-10-CM | POA: Insufficient documentation

## 2014-11-28 MED ORDER — PREDNISONE 10 MG PO TABS: 20.0000 mg | ORAL_TABLET | Freq: Every day | ORAL | Status: DC

## 2014-11-28 MED ORDER — HYDROCORTISONE 1 % EX CREA: TOPICAL_CREAM | CUTANEOUS | Status: DC

## 2014-11-28 NOTE — ED Provider Notes (Signed)
CSN: [id number]     Arrival date & time 11/28/14  1931 History   This chart was scribed for non-physician practitioner, Labrian Torregrossa Irine Seal PA-C, working with Samuel Jester, DO by Freida Busman, ED Scribe. This patient was seen in room TR08C/TR08C and the patient's care was started at 8:38 PM.   Chief Complaint  Patient presents with  . Rash    The history is provided by the patient. No language interpreter was used.     HPI Comments:  Alexandra Ramsey is a 24 y.o. female who presents to the Emergency Department complaining of rash that started  4 days ago. She states the rash started on the top her left foot but she has now noticed similar spots to her right thigh, bilateral buttocks and left arm. She notes the areas are itchy and when she scratches the area "they burn". She also  reports generalized body aches and fatigue that started today. She denies fever, and drainage from the rash sites. No alleviating factors noted.   Past Medical History  Diagnosis Date  . Ulcerative colitis 05/29/08 & 04/29/10    Colonoscopy with biopsy Dx'd  . Ulcerative colitis    Past Surgical History  Procedure Laterality Date  . Colonoscopy w/ biopsies  05/29/08 and 05/08/10    Biopsy consistent with colitis   Family History  Problem Relation Age of Onset  . Colon polyps Father   . Hypertension Father   . Colon polyps Maternal Grandmother   . Colon polyps Paternal Grandmother   . Inflammatory bowel disease Neg Hx   . Hypertension Mother    History  Substance Use Topics  . Smoking status: Current Every Day Smoker -- 0.10 packs/day    Types: Cigarettes  . Smokeless tobacco: Never Used  . Alcohol Use: Yes     Comment: occasionally   OB History    No data available     Review of Systems  Constitutional: Positive for fatigue. Negative for fever and chills.  Musculoskeletal: Positive for myalgias.  Skin: Positive for rash.  All other systems reviewed and are negative.     Allergies   Ibuprofen  Home Medications   Prior to Admission medications   Medication Sig Start Date End Date Taking? Authorizing Provider  Aspirin-Salicylamide-Caffeine (BC HEADACHE PO) Take 1 each by mouth as needed (headache).    Historical Provider, MD  hydrocortisone cream 1 % Apply to affected area 2 times daily 11/28/14   Dorthula Matas, PA-C  predniSONE (DELTASONE) 10 MG tablet Take 2 tablets (20 mg total) by mouth daily. Prednisone dose pack directions:   6 tabs on day one, 5 tabs on day two, 4 tabs on day three, 3 tabs on day four, 2 tabs on day five, 1 tab on day six. Disp # [id number] PA-C 11/28/14   Dorthula Matas, PA-C  pseudoephedrine (SUDAFED) 60 MG tablet Take 1 tablet (60 mg total) by mouth every 6 (six) hours as needed for congestion. Patient not taking: Reported on 10/17/2014 03/31/14   Renne Crigler, PA-C   BP 123/73 mmHg  Pulse 85  Temp(Src) 97.9 F (36.6 C) (Oral)  Resp 16  Ht  (1.651 m)  Wt 255 lb (115.667 kg)  BMI 42.43 kg/m2  SpO2 100% Physical Exam  Constitutional: She is oriented to person, place, and time. She appears well-developed and well-nourished. No distress.  HENT:  Head: Normocephalic and atraumatic.  Cardiovascular: Normal rate.   Pulmonary/Chest: Effort normal.  Abdominal: She  exhibits no distension.  Neurological: She is alert and oriented to person, place, and time.  Skin: Skin is warm and dry. No abrasion, no burn and no purpura noted. Rash is not nodular, not pustular and not vesicular.  Multiple plaques to top of left foot, right foot, right thigh, upper buttocks, low back, left arm. They appear excoriated.  Psychiatric: She has a normal mood and affect.  Nursing note and vitals reviewed.   ED Course  Procedures   DIAGNOSTIC STUDIES:  Oxygen Saturation is 100% on RA, normal by my interpretation.    COORDINATION OF CARE:  8:50 PM Pt denies h/o diabetes. Will send home with oral steroids and topical hydrocortisone cream;  instructed pt to stop steroids if rash worsens. Discussed treatment plan with pt at bedside and pt agreed to plan. Dahlia ClientHannah Muthersbaugh, PA-C looked at rash as well and agrees that trying prednisone dose pack and topical steroids would be best. Pt has ulcerative colitis and has not needed steroids for a prolong period of time. She is not a diabetic. Advised to dc steroids if making rash worse. To see PCP on Monday for follow-up consider blood work/ biopsy if rash is not improving.  Labs Review Labs Reviewed - No data to display  Imaging Review No results found.   EKG Interpretation None      MDM   Final diagnoses:  Rash   23 y.o.Alexandra Ramsey's evaluation in the Emergency Department is complete. It has been determined that no acute conditions requiring further emergency intervention are present at this time. The patient/guardian have been advised of the diagnosis and plan. We have discussed signs and symptoms that warrant return to the ED, such as changes or worsening in symptoms.  Vital signs are stable at discharge. Filed Vitals:   11/28/14 1933  BP: 123/73  Pulse: 85  Temp: 97.9 F (36.6 C)  Resp: 16    Patient/guardian has voiced understanding and agreed to follow-up with the PCP or specialist.  I personally performed the services described in this documentation, which was scribed in my presence. The recorded information has been reviewed and is accurate.    Dorthula Matasiffany G Zakery Normington, PA-C 11/28/14 [phone]  Samuel JesterKathleen McManus, DO 11/30/14 1414

## 2014-11-28 NOTE — ED Notes (Signed)
Pt in c/o generalized rash for 3 days, denies pain, no one else at home with similar rash

## 2014-11-28 NOTE — Discharge Instructions (Signed)
°  DISCONTINUE PREDNISONE IF RASH WORSENS.  Rash A rash is a change in the color or texture of your skin. There are many different types of rashes. You may have other problems that accompany your rash. CAUSES   Infections.  Allergic reactions. This can include allergies to pets or foods.  Certain medicines.  Exposure to certain chemicals, soaps, or cosmetics.  Heat.  Exposure to poisonous plants.  Tumors, both cancerous and noncancerous. SYMPTOMS   Redness.  Scaly skin.  Itchy skin.  Dry or cracked skin.  Bumps.  Blisters.  Pain. DIAGNOSIS  Your caregiver may do a physical exam to determine what type of rash you have. A skin sample (biopsy) may be taken and examined under a microscope. TREATMENT  Treatment depends on the type of rash you have. Your caregiver may prescribe certain medicines. For serious conditions, you may need to see a skin doctor (dermatologist). HOME CARE INSTRUCTIONS   Avoid the substance that caused your rash.  Do not scratch your rash. This can cause infection.  You may take cool baths to help stop itching.  Only take over-the-counter or prescription medicines as directed by your caregiver.  Keep all follow-up appointments as directed by your caregiver. SEEK IMMEDIATE MEDICAL CARE IF:  You have increasing pain, swelling, or redness.  You have a fever.  You have new or severe symptoms.  You have body aches, diarrhea, or vomiting.  Your rash is not better after 3 days. MAKE SURE YOU:  Understand these instructions.  Will watch your condition.  Will get help right away if you are not doing well or get worse. Document Released: 10/06/2002 Document Revised: 01/08/2012 Document Reviewed: 07/31/2011 [facility] Patient Information 2015 [place], [place]. This information is not intended to replace advice given to you by your health care provider. Make sure you discuss any questions you have with your health care provider.

## 2014-12-19 IMAGING — CR DG WRIST COMPLETE 3+V*L*
4 series · 4 of 4 positions shown · non-contrast
Comparison: 10/12/2004

CLINICAL DATA: Left wrist pain for 1 month.

EXAM:
LEFT WRIST - COMPLETE 3+ VIEW

[view not recorded (1 of 4)]
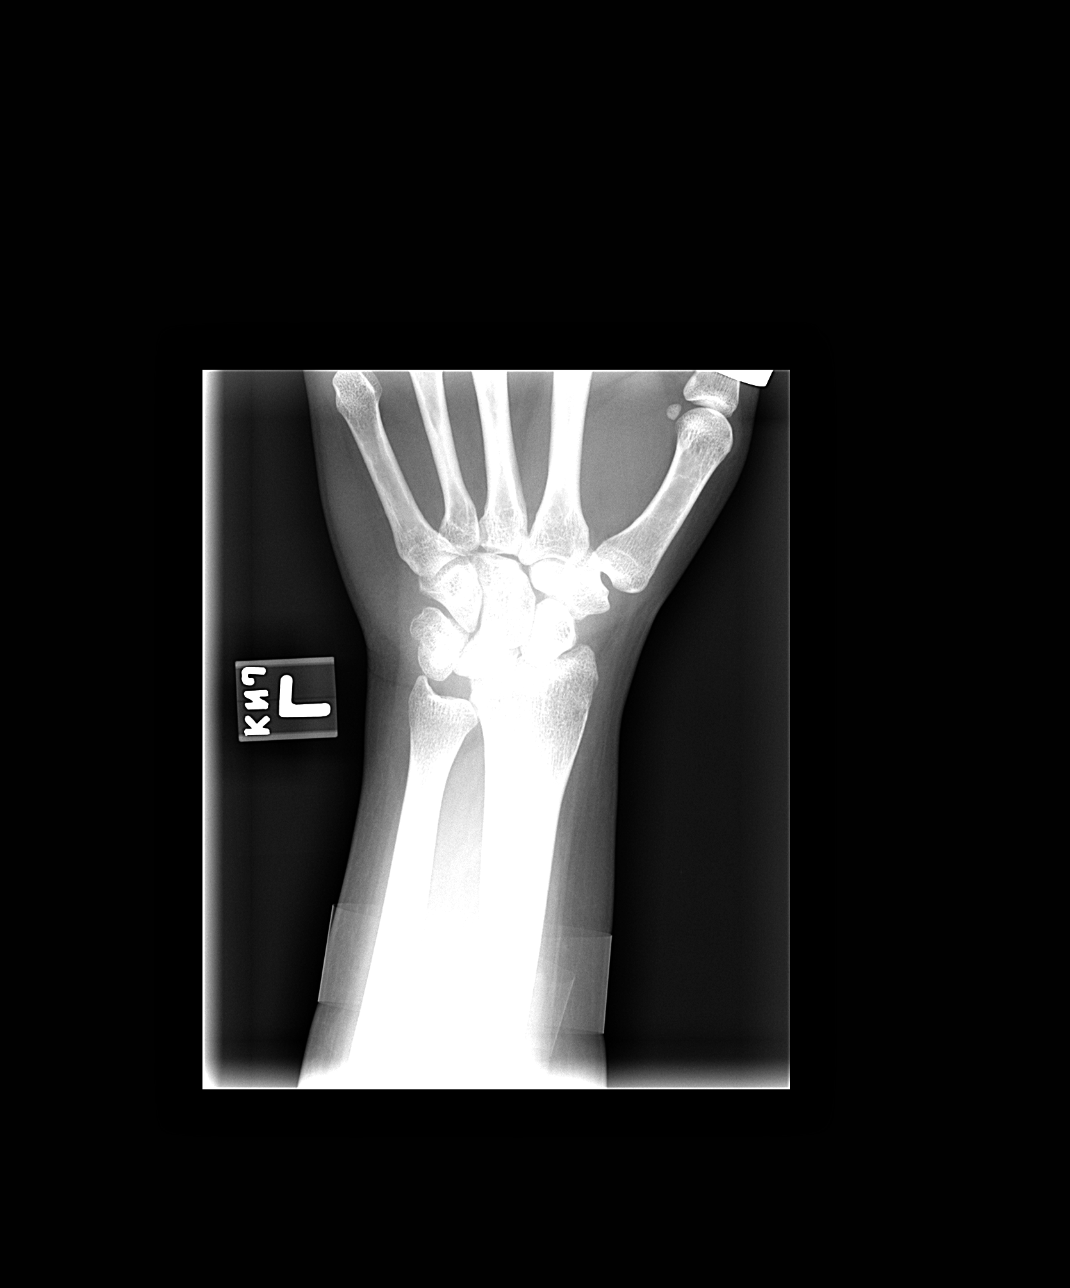

[view not recorded (2 of 4)]
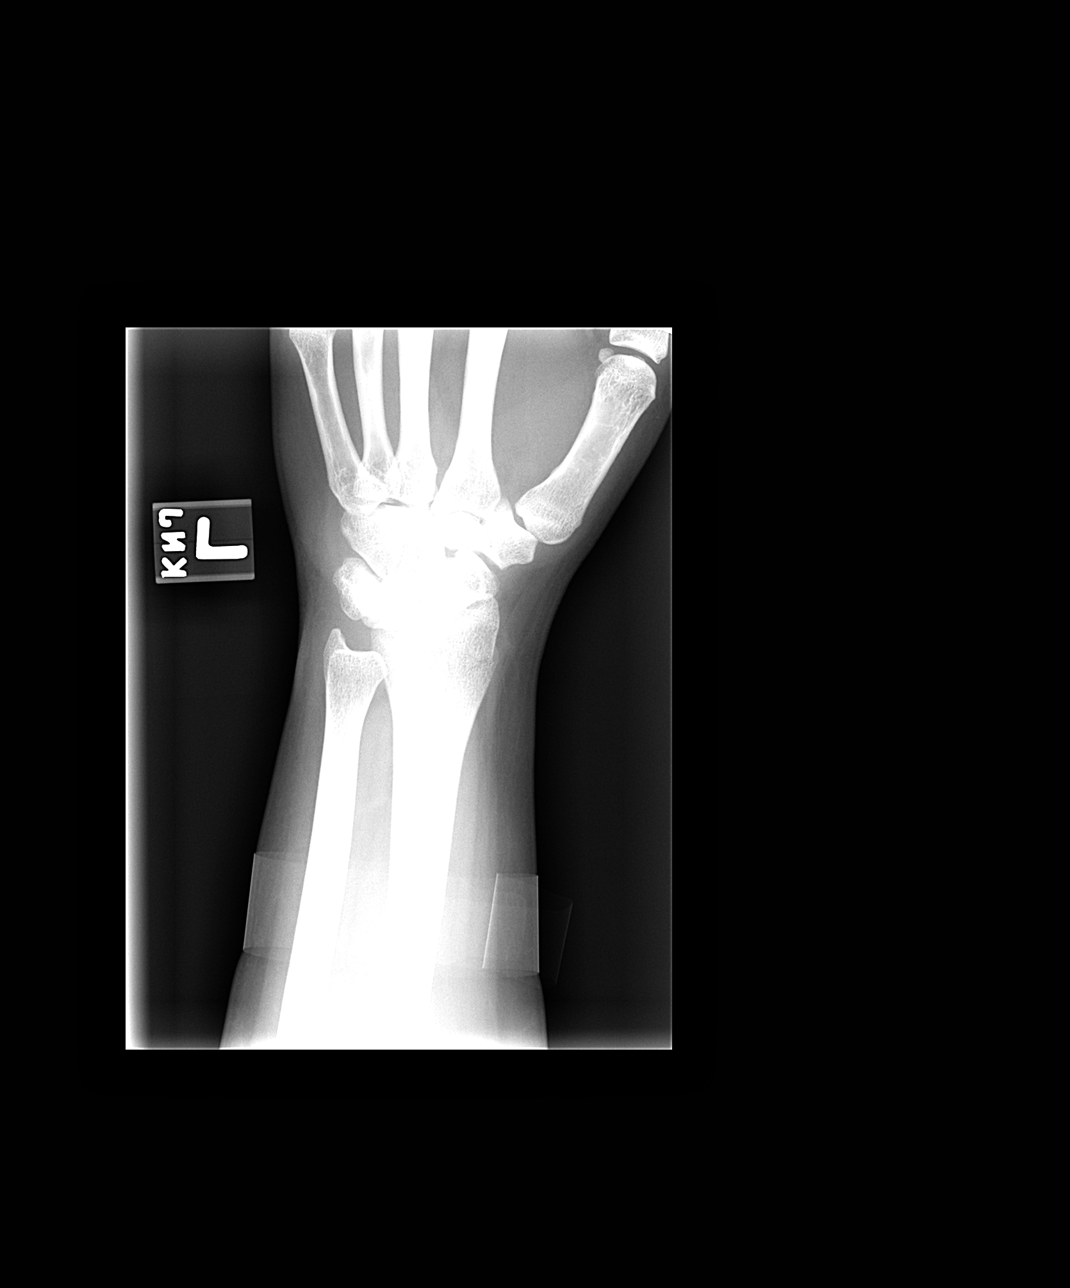

[view not recorded (3 of 4)]
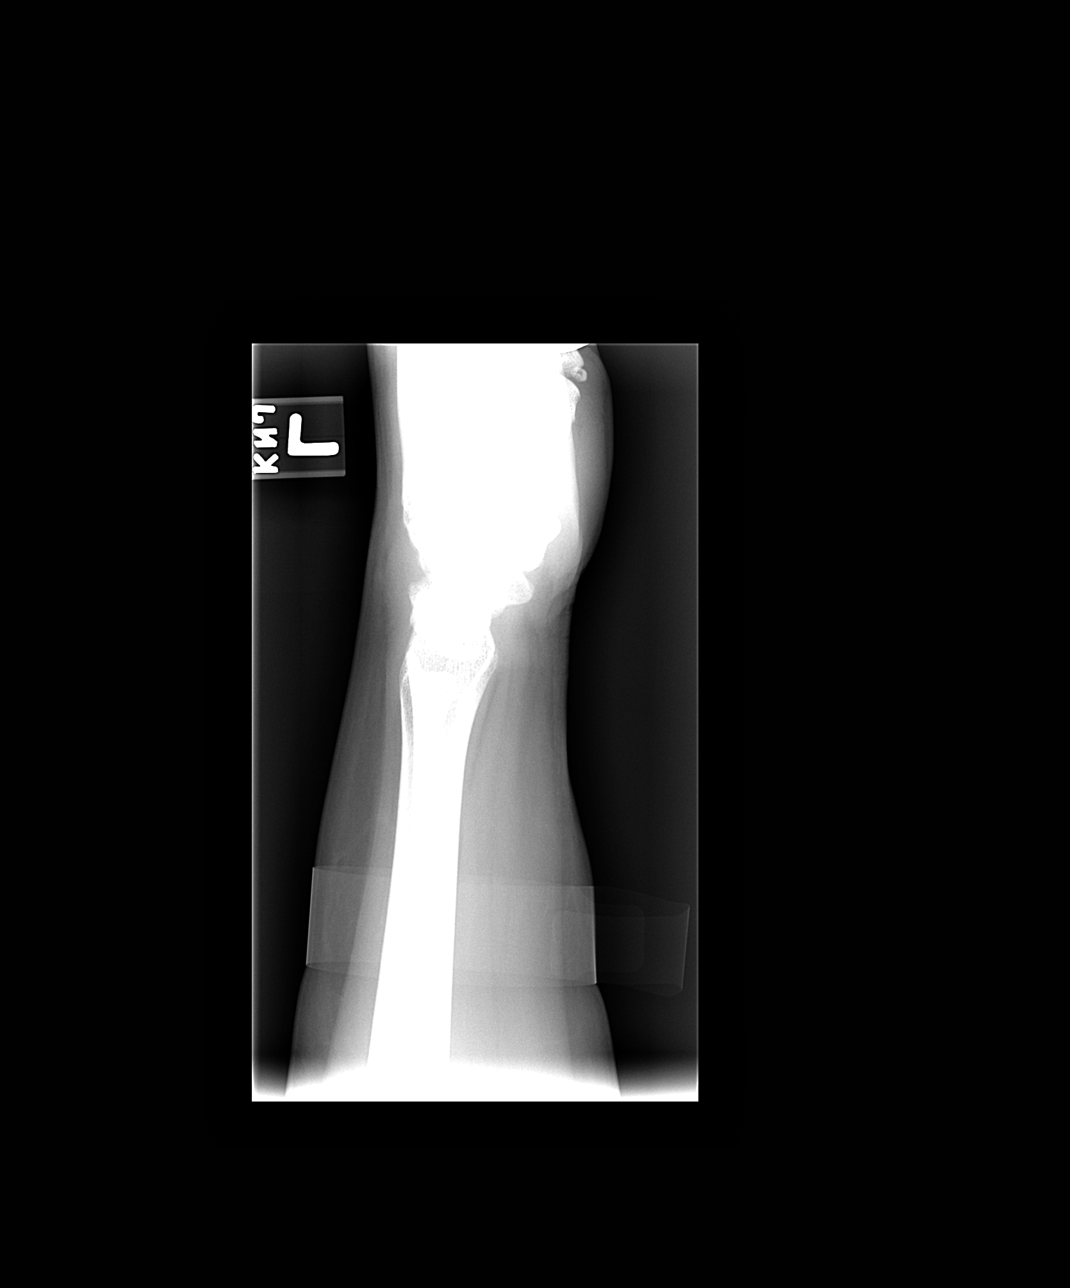

[view not recorded (4 of 4)]
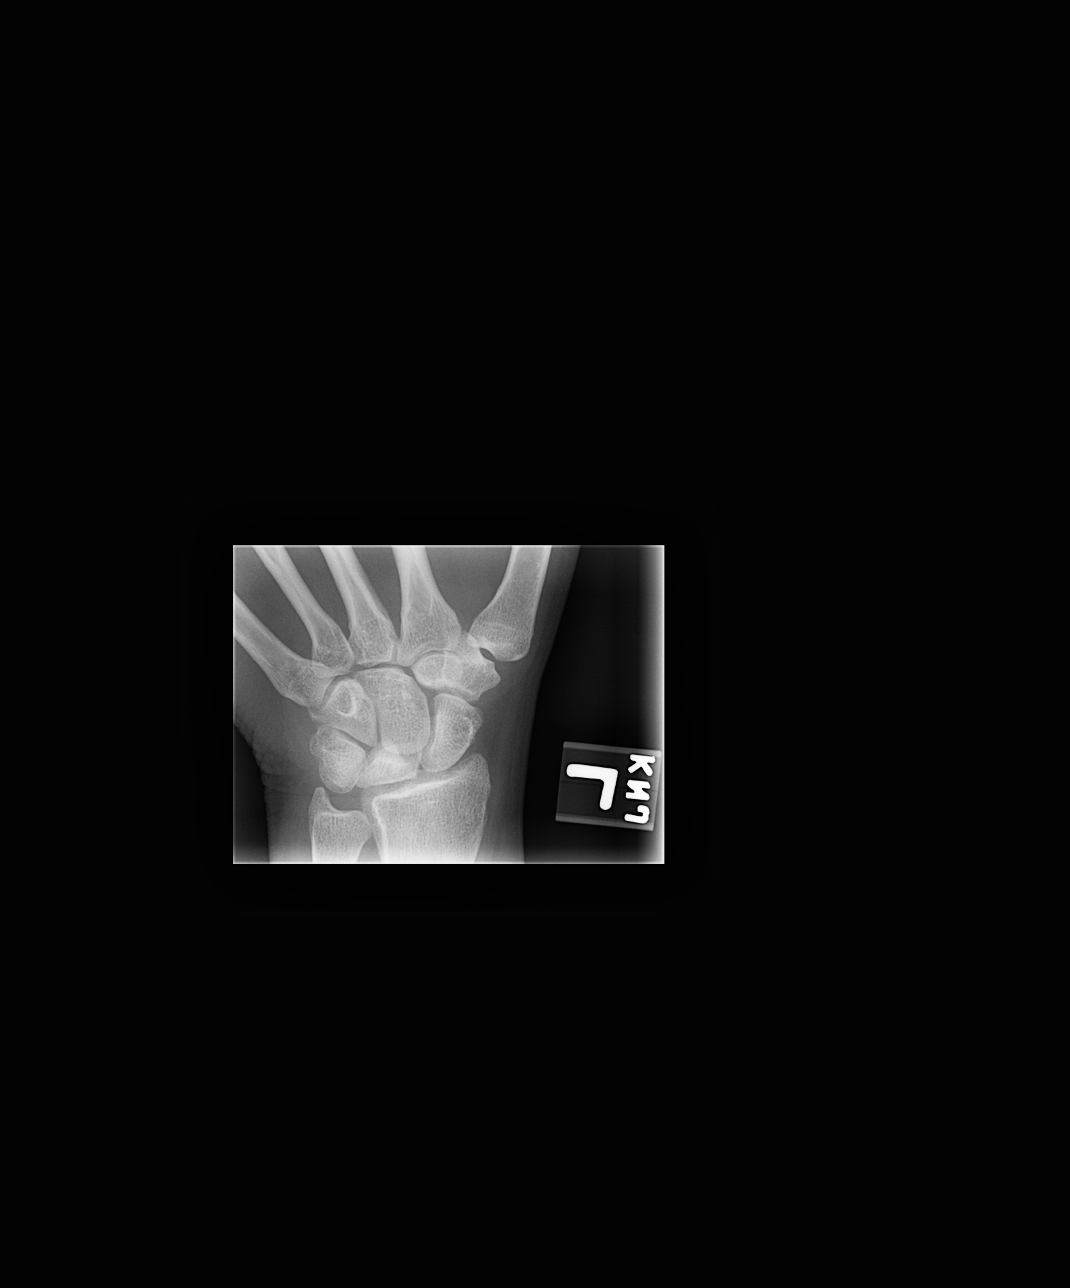

[4 of 4 positions shown; findings below may reference images not displayed]

FINDINGS: There is no evidence of acute fracture or dislocation. Slight ulnar
minus variance is noted. Bone mineralization appears normal. Joint
spaces are preserved. No soft tissue abnormality is seen.
IMPRESSION: No evidence of acute osseous abnormality.

## 2014-12-20 ENCOUNTER — Encounter (HOSPITAL_COMMUNITY): Payer: Self-pay | Admitting: *Deleted

## 2014-12-20 ENCOUNTER — Emergency Department (INDEPENDENT_AMBULATORY_CARE_PROVIDER_SITE_OTHER)
Admission: EM | Admit: 2014-12-20 | Discharge: 2014-12-20 | Disposition: A | Discharge status: Home / Self Care | Payer: Self-pay | Source: Home / Self Care | Attending: Family Medicine | Admitting: Family Medicine

## 2014-12-20 DIAGNOSIS — H6011 Cellulitis of right external ear: Secondary | ICD-10-CM

## 2014-12-20 MED ORDER — CEPHALEXIN 500 MG PO CAPS: 500.0000 mg | ORAL_CAPSULE | Freq: Three times a day (TID) | ORAL | Status: DC

## 2014-12-20 MED ORDER — MUPIROCIN 2 % EX OINT: TOPICAL_OINTMENT | CUTANEOUS | Status: DC

## 2014-12-20 NOTE — ED Notes (Signed)
Pt  Has  Swelling  Drainage   And     Pain  Of  The  r  Ear       She  Reports   She  Snagged  An  Earring         sev  Days   Ago

## 2014-12-20 NOTE — ED Provider Notes (Signed)
CSN: [id number]     Arrival date & time 12/20/14  1150 History   First MD Initiated Contact with Patient 12/20/14 1237     Chief Complaint  Patient presents with  . Ear Problem   (Consider location/radiation/quality/duration/timing/severity/associated sxs/prior Treatment) Patient is a 24 y.o. female presenting with ear pain. The history is provided by the patient.  Otalgia Location:  Right Behind ear:  No abnormality Quality:  Throbbing Severity:  Moderate Onset quality:  Sudden Duration:  1 day Progression:  Worsening Chronicity:  New Context comment:  Trying on new ear rings and sx began yest. Relieved by:  None tried Worsened by:  Nothing tried Associated symptoms: rash   Associated symptoms: no ear discharge and no fever     Past Medical History  Diagnosis Date  . Ulcerative colitis 05/29/08 & 04/29/10    Colonoscopy with biopsy Dx'd  . Ulcerative colitis    Past Surgical History  Procedure Laterality Date  . Colonoscopy w/ biopsies  05/29/08 and 05/08/10    Biopsy consistent with colitis   Family History  Problem Relation Age of Onset  . Colon polyps Father   . Hypertension Father   . Colon polyps Maternal Grandmother   . Colon polyps Paternal Grandmother   . Inflammatory bowel disease Neg Hx   . Hypertension Mother    History  Substance Use Topics  . Smoking status: Current Every Day Smoker -- 0.10 packs/day    Types: Cigarettes  . Smokeless tobacco: Never Used  . Alcohol Use: Yes     Comment: occasionally   OB History    No data available     Review of Systems  Constitutional: Negative.  Negative for fever.  HENT: Positive for ear pain. Negative for ear discharge and facial swelling.   Skin: Positive for rash.  Hematological: Positive for adenopathy.    Allergies  Ibuprofen  Home Medications   Prior to Admission medications   Medication Sig Start Date End Date Taking? Authorizing Provider  Aspirin-Salicylamide-Caffeine (BC HEADACHE PO) Take 1  each by mouth as needed (headache).    Historical Provider, MD  cephALEXin (KEFLEX) 500 MG capsule Take 1 capsule (500 mg total) by mouth 3 (three) times daily. Take all of medicine and drink lots of fluids 12/20/14   Linna HoffJames D Kindl, MD  hydrocortisone cream 1 % Apply to affected area 2 times daily 11/28/14   Dorthula Matasiffany G Greene, PA-C  mupirocin ointment (BACTROBAN) 2 % Apply to skin tid after warm soak 12/20/14   Linna HoffJames D Kindl, MD  predniSONE (DELTASONE) 10 MG tablet Take 2 tablets (20 mg total) by mouth daily. Prednisone dose pack directions:   6 tabs on day one, 5 tabs on day two, 4 tabs on day three, 3 tabs on day four, 2 tabs on day five, 1 tab on day six. Disp # [id number]21  Tiffany Greene, PA-C 11/28/14   Dorthula Matasiffany G Greene, PA-C  pseudoephedrine (SUDAFED) 60 MG tablet Take 1 tablet (60 mg total) by mouth every 6 (six) hours as needed for congestion. Patient not taking: Reported on 10/17/2014 03/31/14   Renne CriglerJoshua Geiple, PA-C   BP 132/84 mmHg  Pulse 73  Temp(Src) 98.5 F (36.9 C) (Oral)  Resp 18  SpO2 100%  LMP 12/04/2014 Physical Exam  Constitutional: She is oriented to person, place, and time. She appears well-developed and well-nourished. She appears distressed.  HENT:  Head: Normocephalic.  Right Ear: Ear canal normal.  Left Ear: External ear normal.  Ears:  Mouth/Throat: Oropharynx  is clear and moist.  Eyes: Pupils are equal, round, and reactive to light.  Neck: Normal range of motion. Neck supple.  Lymphadenopathy:    She has cervical adenopathy.  Neurological: She is alert and oriented to person, place, and time.  Skin: Skin is warm and dry.  Nursing note and vitals reviewed.   ED Course  Procedures (including critical care time) Labs Review Labs Reviewed - No data to display  Imaging Review No results found.   MDM   1. Cellulitis of right earlobe        Linna Hoff, MD 12/20/14 [phone]

## 2014-12-22 ENCOUNTER — Inpatient Hospital Stay (HOSPITAL_COMMUNITY)
Admission: EM | Admit: 2014-12-22 | Discharge: 2014-12-25 | DRG: 603 | Disposition: A | Discharge status: Home / Self Care | Payer: Self-pay | Attending: Internal Medicine | Admitting: Internal Medicine

## 2014-12-22 ENCOUNTER — Encounter (HOSPITAL_COMMUNITY): Payer: Self-pay | Admitting: Physical Medicine and Rehabilitation

## 2014-12-22 ENCOUNTER — Emergency Department (HOSPITAL_COMMUNITY): Payer: Self-pay

## 2014-12-22 DIAGNOSIS — Z79899 Other long term (current) drug therapy: Secondary | ICD-10-CM

## 2014-12-22 DIAGNOSIS — Z6839 Body mass index (BMI) 39.0-39.9, adult: Secondary | ICD-10-CM

## 2014-12-22 DIAGNOSIS — F17210 Nicotine dependence, cigarettes, uncomplicated: Secondary | ICD-10-CM | POA: Diagnosis present

## 2014-12-22 DIAGNOSIS — L03221 Cellulitis of neck: Secondary | ICD-10-CM | POA: Diagnosis present

## 2014-12-22 DIAGNOSIS — L0211 Cutaneous abscess of neck: Principal | ICD-10-CM | POA: Diagnosis present

## 2014-12-22 DIAGNOSIS — K5190 Ulcerative colitis, unspecified, without complications: Secondary | ICD-10-CM | POA: Diagnosis present

## 2014-12-22 DIAGNOSIS — E669 Obesity, unspecified: Secondary | ICD-10-CM | POA: Diagnosis present

## 2014-12-22 HISTORY — DX: Personal history of other medical treatment: Z92.89

## 2014-12-22 HISTORY — DX: Cutaneous abscess of neck: L02.11

## 2014-12-22 HISTORY — DX: Cellulitis of neck: L03.221

## 2014-12-22 LAB — CBC WITH DIFFERENTIAL/PLATELET
BASOS PCT: 0 % (ref 0–1)
Basophils Absolute: 0 10*3/uL (ref 0.0–0.1)
EOS ABS: 0.2 10*3/uL (ref 0.0–0.7)
Eosinophils Relative: 2 % (ref 0–5)
HCT: 38.6 % (ref 36.0–46.0)
Hemoglobin: 12.9 g/dL (ref 12.0–15.0)
LYMPHS ABS: 1.2 10*3/uL (ref 0.7–4.0)
Lymphocytes Relative: 11 % — ABNORMAL LOW (ref 12–46)
MCH: 27.2 pg (ref 26.0–34.0)
MCHC: 33.4 g/dL (ref 30.0–36.0)
MCV: 81.3 fL (ref 78.0–100.0)
MONO ABS: 0.9 10*3/uL (ref 0.1–1.0)
MONOS PCT: 8 % (ref 3–12)
Neutro Abs: 8.8 10*3/uL — ABNORMAL HIGH (ref 1.7–7.7)
Neutrophils Relative %: 79 % — ABNORMAL HIGH (ref 43–77)
Platelets: 238 10*3/uL (ref 150–400)
RBC: 4.75 MIL/uL (ref 3.87–5.11)
RDW: 14 % (ref 11.5–15.5)
WBC: 11 10*3/uL — ABNORMAL HIGH (ref 4.0–10.5)

## 2014-12-22 LAB — PROTIME-INR
INR: 1.16 (ref 0.00–1.49)
PROTHROMBIN TIME: 15 s (ref 11.6–15.2)

## 2014-12-22 LAB — I-STAT CHEM 8, ED
BUN: 3 mg/dL — ABNORMAL LOW (ref 6–23)
CREATININE: 0.6 mg/dL (ref 0.50–1.10)
Calcium, Ion: 1.15 mmol/L (ref 1.12–1.23)
Chloride: 103 mmol/L (ref 96–112)
GLUCOSE: 83 mg/dL (ref 70–99)
HCT: 45 % (ref 36.0–46.0)
HEMOGLOBIN: 15.3 g/dL — AB (ref 12.0–15.0)
POTASSIUM: 3.9 mmol/L (ref 3.5–5.1)
Sodium: 138 mmol/L (ref 135–145)
TCO2: 21 mmol/L (ref 0–100)

## 2014-12-22 LAB — POC URINE PREG, ED: Preg Test, Ur: NEGATIVE

## 2014-12-22 LAB — I-STAT CG4 LACTIC ACID, ED
LACTIC ACID, VENOUS: 1.03 mmol/L (ref 0.5–2.0)
LACTIC ACID, VENOUS: 1.02 mmol/L (ref 0.5–2.0)

## 2014-12-22 LAB — TSH: TSH: 2.927 u[IU]/mL (ref 0.350–4.500)

## 2014-12-22 MED ORDER — ACETAMINOPHEN 325 MG PO TABS: 650.0000 mg | ORAL_TABLET | Freq: Four times a day (QID) | ORAL | Status: DC | PRN

## 2014-12-22 MED ORDER — IOHEXOL 300 MG/ML  SOLN
75.0000 mL | Freq: Once | INTRAMUSCULAR | Status: AC | PRN
  Administered 2014-12-22: 75 mL via INTRAVENOUS

## 2014-12-22 MED ORDER — HYDROCODONE-ACETAMINOPHEN 5-325 MG PO TABS
1.0000 | ORAL_TABLET | ORAL | Status: DC | PRN
  Administered 2014-12-22 – 2014-12-23 (×4): 2 via ORAL
  Filled 2014-12-22 (×4): qty 2

## 2014-12-22 MED ORDER — MORPHINE SULFATE 2 MG/ML IJ SOLN
1.0000 mg | INTRAMUSCULAR | Status: DC | PRN
  Administered 2014-12-23: 1 mg via INTRAVENOUS
  Filled 2014-12-22: qty 1

## 2014-12-22 MED ORDER — SODIUM CHLORIDE 0.9 % IV SOLN
INTRAVENOUS | Status: AC
  Administered 2014-12-22 – 2014-12-23 (×2): via INTRAVENOUS

## 2014-12-22 MED ORDER — HEPARIN SODIUM (PORCINE) 5000 UNIT/ML IJ SOLN
5000.0000 [IU] | Freq: Three times a day (TID) | INTRAMUSCULAR | Status: DC
  Administered 2014-12-22 – 2014-12-25 (×7): 5000 [IU] via SUBCUTANEOUS
  Filled 2014-12-22 (×9): qty 1

## 2014-12-22 MED ORDER — MORPHINE SULFATE 4 MG/ML IJ SOLN
4.0000 mg | Freq: Once | INTRAMUSCULAR | Status: AC
  Administered 2014-12-22: 4 mg via INTRAVENOUS
  Filled 2014-12-22: qty 1

## 2014-12-22 MED ORDER — ACETAMINOPHEN 650 MG RE SUPP: 650.0000 mg | Freq: Four times a day (QID) | RECTAL | Status: DC | PRN

## 2014-12-22 MED ORDER — PIPERACILLIN-TAZOBACTAM 3.375 G IVPB
3.3750 g | Freq: Three times a day (TID) | INTRAVENOUS | Status: DC
  Filled 2014-12-22 (×2): qty 50

## 2014-12-22 MED ORDER — ONDANSETRON HCL 4 MG/2ML IJ SOLN
4.0000 mg | Freq: Once | INTRAMUSCULAR | Status: AC
  Administered 2014-12-22: 4 mg via INTRAVENOUS
  Filled 2014-12-22: qty 2

## 2014-12-22 MED ORDER — PIPERACILLIN-TAZOBACTAM 3.375 G IVPB 30 MIN
3.3750 g | Freq: Once | INTRAVENOUS | Status: AC
  Administered 2014-12-22: 3.375 g via INTRAVENOUS
  Filled 2014-12-22: qty 50

## 2014-12-22 MED ORDER — SODIUM CHLORIDE 0.9 % IJ SOLN
3.0000 mL | Freq: Two times a day (BID) | INTRAMUSCULAR | Status: DC
  Administered 2014-12-24: 3 mL via INTRAVENOUS

## 2014-12-22 NOTE — ED Provider Notes (Signed)
CSN: [id number]     Arrival date & time 12/22/14  1224 History  This chart was scribed for non-physician practitioner, Roxy Horseman, PA-C working with Richardean Canal, MD by Greggory Stallion, ED scribe. This patient was seen in room TR08C/TR08C and the patient's care was started at 12:38 PM.   Chief Complaint  Patient presents with  . Otalgia   The history is provided by the patient. No language interpreter was used.    HPI Comments: Alexandra Ramsey is a 24 y.o. female who presents to the Emergency Department complaining of increased swelling and pain to her neck that she rates 9/10 that started 2 days ago. Pt was evaluated at urgent care for right ear pain on 12/20/14 and was diagnosed with cellulitis of the ear lobe and discharged home with clindamycin. She doesn't think the antibiotic has provided very much relief. Pt denies fever, emesis.   Past Medical History  Diagnosis Date  . Ulcerative colitis 05/29/08 & 04/29/10    Colonoscopy with biopsy Dx'd  . Ulcerative colitis    Past Surgical History  Procedure Laterality Date  . Colonoscopy w/ biopsies  05/29/08 and 05/08/10    Biopsy consistent with colitis   Family History  Problem Relation Age of Onset  . Colon polyps Father   . Hypertension Father   . Colon polyps Maternal Grandmother   . Colon polyps Paternal Grandmother   . Inflammatory bowel disease Neg Hx   . Hypertension Mother    History  Substance Use Topics  . Smoking status: Current Every Day Smoker -- 0.10 packs/day    Types: Cigarettes  . Smokeless tobacco: Never Used  . Alcohol Use: Yes     Comment: occasionally   OB History    No data available     Review of Systems  Constitutional: Negative for fever.  HENT: Positive for ear pain.   Eyes: Negative for redness.  Respiratory: Negative for shortness of breath.   Cardiovascular: Negative for chest pain.  Gastrointestinal: Negative for vomiting.  Musculoskeletal: Negative for gait problem.  Skin: Negative for  rash.  Neurological: Negative for speech difficulty.  Psychiatric/Behavioral: Negative for confusion.   Allergies  Ibuprofen  Home Medications   Prior to Admission medications   Medication Sig Start Date End Date Taking? Authorizing Provider  Aspirin-Salicylamide-Caffeine (BC HEADACHE PO) Take 1 each by mouth as needed (headache).    Historical Provider, MD  cephALEXin (KEFLEX) 500 MG capsule Take 1 capsule (500 mg total) by mouth 3 (three) times daily. Take all of medicine and drink lots of fluids 12/20/14   Linna Hoff, MD  hydrocortisone cream 1 % Apply to affected area 2 times daily 11/28/14   Dorthula Matas, PA-C  mupirocin ointment (BACTROBAN) 2 % Apply to skin tid after warm soak 12/20/14   Linna Hoff, MD  predniSONE (DELTASONE) 10 MG tablet Take 2 tablets (20 mg total) by mouth daily. Prednisone dose pack directions:   6 tabs on day one, 5 tabs on day two, 4 tabs on day three, 3 tabs on day four, 2 tabs on day five, 1 tab on day six. Disp # [id number], PA-C 11/28/14   Dorthula Matas, PA-C  pseudoephedrine (SUDAFED) 60 MG tablet Take 1 tablet (60 mg total) by mouth every 6 (six) hours as needed for congestion. Patient not taking: Reported on 10/17/2014 03/31/14   Renne Crigler, PA-C   BP 134/98 mmHg  Pulse 95  Temp(Src) 98.6 F (37 C) (  Oral)  Resp 18  SpO2 99%  LMP 12/04/2014   Physical Exam  Constitutional: She is oriented to person, place, and time. She appears well-developed and well-nourished. No distress.  HENT:  Head: Normocephalic and atraumatic.  Right earlobe mildly swollen with mild erythema. No evidence of abscess on the ear.  Eyes: Conjunctivae and EOM are normal.  Neck:  4 x 6 cm erythematous, indurated area beneath the right earlobe extending down the neck. Very tender to palpation. No discharge.   Cardiovascular: Normal rate and regular rhythm.   Pulmonary/Chest: Effort normal and breath sounds normal. No stridor. No respiratory distress.   Abdominal: She exhibits no distension.  Musculoskeletal: She exhibits no edema.  Neurological: She is alert and oriented to person, place, and time. No cranial nerve deficit.  Skin: Skin is warm and dry.  Psychiatric: She has a normal mood and affect.  Nursing note and vitals reviewed.   ED Course  Procedures (including critical care time)  EMERGENCY DEPARTMENT US SOFT TISSUE INTERPRETATION "Study: Limited Ultrasound of the noted body part in comments below"  INDICATIONS: Soft tissue infection Multiple views of the body part are obtained with a multi-frequency linear probe  PERFORMED BY:  Myself and Dr. Silverio Lay  IMAGES ARCHIVED?: Yes  SIDE:Right   BODY PART:Neck  FINDINGS: Other abscess present and deep tissue changes observed by Dr. Silverio Lay  LIMITATIONS: None  INTERPRETATION:  Abcess present  COMMENT:  [place] preformed by myself and Dr. Silverio Lay    DIAGNOSTIC STUDIES: Oxygen Saturation is 99% on RA, normal by my interpretation.    COORDINATION OF CARE: 12:41 PM-Will discuss pt with Dr. Silverio Lay.   12:54 PM-Dr. Silverio Lay saw and evaluated pt. Will do CT and lab work.   Labs Review Labs Reviewed  CBC WITH DIFFERENTIAL/PLATELET - Abnormal; Notable for the following:    WBC 11.0 (*)    Neutrophils Relative % 79 (*)    Neutro Abs 8.8 (*)    Lymphocytes Relative 11 (*)    All other components within normal limits  I-STAT CHEM 8, ED - Abnormal; Notable for the following:    BUN 3 (*)    Hemoglobin 15.3 (*)    All other components within normal limits  I-STAT CG4 LACTIC ACID, ED    Imaging Review Ct Soft Tissue Neck W Contrast  12/22/2014   CLINICAL DATA:  Acute neck pain and swelling for 2 days.  EXAM: CT NECK WITH CONTRAST  TECHNIQUE: Multidetector CT imaging of the neck was performed using the standard protocol following the bolus administration of intravenous contrast.  CONTRAST:  75mL OMNIPAQUE IOHEXOL 300 MG/ML  SOLN  COMPARISON:  None.  FINDINGS: Pharynx and larynx: Normal.   Salivary glands: Left parotid gland and submandibular glands appear normal. 17 x 16 mm fluid collection is seen either within the posterior portion of the right parotid gland or immediately posterior to it anterior to the sternocleidomastoid muscle.  Thyroid: Normal.  Lymph nodes: Multiple enlarged lymph nodes are noted on the right side at the level 2 and level 3 regions. The largest measures 14 x 11 mm. There is some associated free fluid in the soft tissues anterior to the right sternocleidomastoid muscle and posterior to the right submandibular gland.  Vascular: Visualized vasculature appears normal.  Limited intracranial: No definite abnormality is seen.  Visualized orbits: No definite abnormality is seen.  Mastoids and visualized paranasal sinuses: No abnormality seen.  Skeleton: No abnormality seen.  Upper chest: No definite abnormality seen.  IMPRESSION: 17 x  16 mm fluid collection consistent with abscess is seen either within the posterior portion of the right parotid gland, or immediately posterior to it. There are surrounding inflammatory changes in the subcutaneous tissues consistent with associated cellulitis, as well as a mild amount of associated fluid between the right sternocleidomastoid muscle and right submandibular gland. Also noted are multiple enlarged lymph nodes deep to the right sternocleidomastoid muscle most likely inflammatory in origin.   Electronically Signed   By: Lupita RaiderJames  Green Jr, M.D.   On: 12/22/2014 14:42     EKG Interpretation None      MDM   Final diagnoses:  Cellulitis and abscess of neck    Patient with cellulitis and abscess on the right side of her neck. Concern for deep space involvement. Patient seen by and discussed with Dr. Silverio LayYao. Will check CT of the neck.  CT remarkable for possible parotid gland involvement. Will treat with Zosyn, and admits the hospital. Patient has essentially filled outpatient therapy given that she has been on clindamycin for the past  2 days.  Patient discussed with Dr. Annalee GentaShoemaker of ENT, who advises against surgery at this time, but is agreeable with plan to admit the patient.  Appreciate Dr. Arthor CaptainElmahi for admission.  I personally performed the services described in this documentation, which was scribed in my presence. The recorded information has been reviewed and is accurate.    Roxy Horsemanobert Thais Silberstein, PA-C 12/22/14 1602  Richardean Canalavid H Yao, MD 12/23/14 [phone]

## 2014-12-22 NOTE — ED Notes (Signed)
Pt presents to department for evaluation of possible infection to R ear. States she wore new earring last week, now states red painful area with yellow drainage, [facility] on Clindamycin, no relief with medicine.

## 2014-12-22 NOTE — H&P (Signed)
[facility] History and Physical  Alexandra SCHMUTZ UEA:[id number] DOB: [birth year] DOA: 12/22/2014  Referring physician: EDP PCP: Dorrene German, MD   Chief Complaint: Neck swelling, pain and tenderness  HPI: Alexandra Ramsey is a 24 y.o. female with past medical history of chronic colitis, (diagnosed at some point as ulcerative colitis and she was on Imuran and prednisone). Patient came in to the hospital complaining about neck swelling. Patient said about 4 days ago her right ear started to get red around her ear ring, soon after that, the area under the ear started to get red, painful and very sensitive to touch. Patient evaluated in the emergency department on the 21st, given Augmentin and was sent home but she came in today with worsening of the swelling. In the ED CT scan of the neck was done and showed probable abscess next to the sternocleidomastoid versus phlegmon inside the parotid gland. Patient admitted to the hospital for further evaluation, started on Zosyn.     Review of Systems:  Constitutional: negative for anorexia, fevers and sweats Eyes: negative for irritation, redness and visual disturbance Ears, nose, mouth, throat, and face: negative for earaches, epistaxis, nasal congestion and sore throat Respiratory: negative for cough, dyspnea on exertion, sputum and wheezing Cardiovascular: negative for chest pain, dyspnea, lower extremity edema, orthopnea, palpitations and syncope Gastrointestinal: negative for abdominal pain, constipation, diarrhea, melena, nausea and vomiting Genitourinary:negative for dysuria, frequency and hematuria Hematologic/lymphatic: negative for bleeding, easy bruising and lymphadenopathy Musculoskeletal:negative for arthralgias, muscle weakness and stiff joints Neurological: negative for coordination problems, gait problems, headaches and weakness Endocrine: negative for diabetic symptoms including polydipsia, polyuria and weight  loss Allergic/Immunologic: negative for anaphylaxis, hay fever and urticaria  Past Medical History  Diagnosis Date  . Ulcerative colitis 05/29/08 & 04/29/10    Colonoscopy with biopsy Dx'd  . Ulcerative colitis    Past Surgical History  Procedure Laterality Date  . Colonoscopy w/ biopsies  05/29/08 and 05/08/10    Biopsy consistent with colitis   Social History:   reports that she has been smoking Cigarettes.  She has been smoking about 0.10 packs per day. She has never used smokeless tobacco. She reports that she drinks alcohol. She reports that she uses illicit drugs (Marijuana) about 3 times per week.  Allergies  Allergen Reactions  . Ibuprofen Other (See Comments)    Can't take due to UC    Family History  Problem Relation Age of Onset  . Colon polyps Father   . Hypertension Father   . Colon polyps Maternal Grandmother   . Colon polyps Paternal Grandmother   . Inflammatory bowel disease Neg Hx   . Hypertension Mother      Prior to Admission medications   Medication Sig Start Date End Date Taking? Authorizing Provider  Aspirin-Salicylamide-Caffeine (BC HEADACHE PO) Take 1 each by mouth as needed (headache).    Historical Provider, MD  cephALEXin (KEFLEX) 500 MG capsule Take 1 capsule (500 mg total) by mouth 3 (three) times daily. Take all of medicine and drink lots of fluids 12/20/14   Linna Hoff, MD  hydrocortisone cream 1 % Apply to affected area 2 times daily 11/28/14   Dorthula Matas, PA-C  mupirocin ointment (BACTROBAN) 2 % Apply to skin tid after warm soak 12/20/14   Linna Hoff, MD  predniSONE (DELTASONE) 10 MG tablet Take 2 tablets (20 mg total) by mouth daily. Prednisone dose pack directions:   6 tabs on day one, 5 tabs on day two,  4 tabs on day three, 3 tabs on day four, 2 tabs on day five, 1 tab on day six. Disp # [id number], PA-C 11/28/14   Dorthula Matas, PA-C  pseudoephedrine (SUDAFED) 60 MG tablet Take 1 tablet (60 mg total) by mouth every 6 (six)  hours as needed for congestion. Patient not taking: Reported on 10/17/2014 03/31/14   Renne Crigler, PA-C   Physical Exam: Filed Vitals:   12/22/14 1431  BP: 113/70  Pulse: 83  Temp:   Resp: 18   Constitutional: Oriented to person, place, and time. Well-developed and well-nourished. Cooperative.  Head: Normocephalic and atraumatic.  Nose: Nose normal.  Mouth/Throat: Uvula is midline, oropharynx is clear and moist and mucous membranes are normal.  Eyes: Conjunctivae and EOM are normal. Pupils are equal, round, and reactive to light.  Neck: Swelling, redness in the right side of the neck Cardiovascular: Normal rate, regular rhythm, S1 normal, S2 normal, normal heart sounds and intact distal pulses.   Pulmonary/Chest: Effort normal and breath sounds normal.  Abdominal: Soft. Bowel sounds are normal. There is no hepatosplenomegaly. There is no tenderness.  Musculoskeletal: Normal range of motion.  Neurological: Alert and oriented to person, place, and time. Has normal strength. No cranial nerve deficit or sensory deficit.  Skin: Skin is warm, dry and intact.  Psychiatric: Has a normal mood and affect. Speech is normal and behavior is normal.    Labs on Admission:  Basic Metabolic Panel:  Recent Labs Lab 12/22/14 1338  NA 138  K 3.9  CL 103  GLUCOSE 83  BUN 3*  CREATININE 0.60   Liver Function Tests: No results for input(s): AST, ALT, ALKPHOS, BILITOT, PROT, ALBUMIN in the last 168 hours. No results for input(s): LIPASE, AMYLASE in the last 168 hours. No results for input(s): AMMONIA in the last 168 hours. CBC:  Recent Labs Lab 12/22/14 1257 12/22/14 1338  WBC 11.0*  --   NEUTROABS 8.8*  --   HGB 12.9 15.3*  HCT 38.6 45.0  MCV 81.3  --   PLT 238  --    Cardiac Enzymes: No results for input(s): CKTOTAL, CKMB, CKMBINDEX, TROPONINI in the last 168 hours.  BNP (last 3 results) No results for input(s): BNP in the last 8760 hours.  ProBNP (last 3 results) No results  for input(s): PROBNP in the last 8760 hours.  CBG: No results for input(s): GLUCAP in the last 168 hours.  Radiological Exams on Admission: Ct Soft Tissue Neck W Contrast  12/22/2014   CLINICAL DATA:  Acute neck pain and swelling for 2 days.  EXAM: CT NECK WITH CONTRAST  TECHNIQUE: Multidetector CT imaging of the neck was performed using the standard protocol following the bolus administration of intravenous contrast.  CONTRAST:  75mL OMNIPAQUE IOHEXOL 300 MG/ML  SOLN  COMPARISON:  None.  FINDINGS: Pharynx and larynx: Normal.  Salivary glands: Left parotid gland and submandibular glands appear normal. 17 x 16 mm fluid collection is seen either within the posterior portion of the right parotid gland or immediately posterior to it anterior to the sternocleidomastoid muscle.  Thyroid: Normal.  Lymph nodes: Multiple enlarged lymph nodes are noted on the right side at the level 2 and level 3 regions. The largest measures 14 x 11 mm. There is some associated free fluid in the soft tissues anterior to the right sternocleidomastoid muscle and posterior to the right submandibular gland.  Vascular: Visualized vasculature appears normal.  Limited intracranial: No definite abnormality is seen.  Visualized orbits: No definite abnormality is seen.  Mastoids and visualized paranasal sinuses: No abnormality seen.  Skeleton: No abnormality seen.  Upper chest: No definite abnormality seen.  IMPRESSION: 17 x 16 mm fluid collection consistent with abscess is seen either within the posterior portion of the right parotid gland, or immediately posterior to it. There are surrounding inflammatory changes in the subcutaneous tissues consistent with associated cellulitis, as well as a mild amount of associated fluid between the right sternocleidomastoid muscle and right submandibular gland. Also noted are multiple enlarged lymph nodes deep to the right sternocleidomastoid muscle most likely inflammatory in origin.   Electronically  Signed   By: Lupita RaiderJames  Green Jr, M.D.   On: 12/22/2014 14:42    Assessment/Plan Principal Problem:   Cellulitis and abscess of neck Active Problems:   Obesity (BMI 30-39.9)    Cellulitis/abscess of the neck Presented with redness, swelling and tenderness in the right side of her neck. CT scan done and showed 1716 mm fluid collections consistent with abscess. Radiology was to unable tell from the CT if it's the posterior portion of the right parotid gland or immediately posterior to it. Discussed with Dr. Annalee GentaShoemaker of ENT. Recommended Zosyn, he will evaluate the patient in a.m.  Follow ENT recommendation for discharge on oral antibiotics in a.m. or if patient needs intervention.  Cervical lymphadenopathy Multiple cervical lymphadenopathy in the anterior and posterior triangles. This is likely reactive secondary to the cellulitis/abscess.  History of chronic colitis Per patient this is diagnosed as ulcerative colitis before, she was on prednisone and Imuran for it. Patient not taking any medications now, reports that she is doing very 3.   Code Status: Full code Family Communication: Plan discussed with the patient in presence of her parents at bedside Disposition Plan: MedSurg, inpatient  Time spent: 70 minutes  [facility] CenterELMAHI,Doshie Maggi A [facility] Pager [phone]

## 2014-12-22 NOTE — ED Notes (Signed)
Attempted report 

## 2014-12-22 NOTE — Progress Notes (Signed)
ANTIBIOTIC CONSULT NOTE - INITIAL  Pharmacy Consult for Zosyn Indication: Cellulitis  Allergies  Allergen Reactions  . Ibuprofen Other (See Comments)    Can't take due to UC    Patient Measurements: TBW - 115.7 kg as of 11/28/2014  Vital Signs: Temp: 98.6 F (37 C) (02/23 1229) Temp Source: Oral (02/23 1229) BP: 113/70 mmHg (02/23 1431) Pulse Rate: 83 (02/23 1431) Intake/Output from previous day:   Intake/Output from this shift:    Labs:  Recent Labs  12/22/14 1257 12/22/14 1338  WBC 11.0*  --   HGB 12.9 15.3*  PLT 238  --   CREATININE  --  0.60   CrCl cannot be calculated (Unknown ideal weight.). No results for input(s): VANCOTROUGH, VANCOPEAK, VANCORANDOM, GENTTROUGH, GENTPEAK, GENTRANDOM, TOBRATROUGH, TOBRAPEAK, TOBRARND, AMIKACINPEAK, AMIKACINTROU, AMIKACIN in the last 72 hours.   Microbiology: No results found for this or any previous visit (from the past 720 hour(s)).  Medical History: Past Medical History  Diagnosis Date  . Ulcerative colitis 05/29/08 & 04/29/10    Colonoscopy with biopsy Dx'd  . Ulcerative colitis     Medications:   (Not in a hospital admission)   Assessment: 24 yo F presents on 2/23 with ear problem. Pharmacy to dose Zosyn for cellulitis of R ear lobe. Pt is afebrile, WBC slightly elevated at 11. SCr 0.6, CrCl > 12400ml/min. Zosyn 3.375g IV x 1 in the ED  Goal of Therapy:  Resolution of infection  Plan:  Start Zosyn 3.375g IV Q8 Monitor renal function, clinical picture  Dorathea Faerber J 12/22/2014,3:38 PM

## 2014-12-23 ENCOUNTER — Inpatient Hospital Stay (HOSPITAL_COMMUNITY): Payer: MEDICAID | Admitting: Anesthesiology

## 2014-12-23 ENCOUNTER — Inpatient Hospital Stay (HOSPITAL_COMMUNITY): Payer: Self-pay | Admitting: Anesthesiology

## 2014-12-23 ENCOUNTER — Encounter (HOSPITAL_COMMUNITY)
Admission: EM | Disposition: A | Discharge status: Home / Self Care | Payer: Self-pay | Source: Home / Self Care | Attending: Internal Medicine

## 2014-12-23 ENCOUNTER — Encounter (HOSPITAL_COMMUNITY): Payer: Self-pay | Admitting: Anesthesiology

## 2014-12-23 HISTORY — PX: MASS BIOPSY: SHX5445

## 2014-12-23 LAB — SURGICAL PCR SCREEN
MRSA, PCR: NEGATIVE
Staphylococcus aureus: POSITIVE — AB

## 2014-12-23 LAB — COMPREHENSIVE METABOLIC PANEL
ALK PHOS: 59 U/L (ref 39–117)
ALT: 10 U/L (ref 0–35)
ANION GAP: 9 (ref 5–15)
AST: 13 U/L (ref 0–37)
Albumin: 3.1 g/dL — ABNORMAL LOW (ref 3.5–5.2)
BUN: 7 mg/dL (ref 6–23)
CO2: 24 mmol/L (ref 19–32)
CREATININE: 0.72 mg/dL (ref 0.50–1.10)
Calcium: 8.2 mg/dL — ABNORMAL LOW (ref 8.4–10.5)
Chloride: 105 mmol/L (ref 96–112)
GFR calc Af Amer: >90 mL/min (ref >90)
GFR calc non Af Amer: >90 mL/min (ref >90)
Glucose, Bld: 79 mg/dL (ref 70–99)
POTASSIUM: 4.2 mmol/L (ref 3.5–5.1)
Sodium: 138 mmol/L (ref 135–145)
Total Bilirubin: 0.5 mg/dL (ref 0.3–1.2)
Total Protein: 6.2 g/dL (ref 6.0–8.3)

## 2014-12-23 SURGERY — BIOPSY, MASS, NECK
Anesthesia: General | Site: Neck | Laterality: Right

## 2014-12-23 MED ORDER — DEXAMETHASONE SODIUM PHOSPHATE 10 MG/ML IJ SOLN
INTRAMUSCULAR | Status: AC
  Filled 2014-12-23: qty 1

## 2014-12-23 MED ORDER — PROPOFOL 10 MG/ML IV BOLUS
INTRAVENOUS | Status: AC
  Filled 2014-12-23: qty 20

## 2014-12-23 MED ORDER — MEPERIDINE HCL 25 MG/ML IJ SOLN: 6.2500 mg | INTRAMUSCULAR | Status: DC | PRN

## 2014-12-23 MED ORDER — BACITRACIN ZINC 500 UNIT/GM EX OINT
TOPICAL_OINTMENT | CUTANEOUS | Status: AC
  Filled 2014-12-23: qty 28.35

## 2014-12-23 MED ORDER — DEXAMETHASONE SODIUM PHOSPHATE 10 MG/ML IJ SOLN
INTRAMUSCULAR | Status: DC | PRN
  Administered 2014-12-23: 10 mg via INTRAVENOUS

## 2014-12-23 MED ORDER — SUCCINYLCHOLINE CHLORIDE 20 MG/ML IJ SOLN
INTRAMUSCULAR | Status: DC | PRN
  Administered 2014-12-23: 120 mg via INTRAVENOUS

## 2014-12-23 MED ORDER — FENTANYL CITRATE 0.05 MG/ML IJ SOLN
INTRAMUSCULAR | Status: AC
  Filled 2014-12-23: qty 5

## 2014-12-23 MED ORDER — PROMETHAZINE HCL 25 MG/ML IJ SOLN: 6.2500 mg | INTRAMUSCULAR | Status: DC | PRN

## 2014-12-23 MED ORDER — FENTANYL CITRATE 0.05 MG/ML IJ SOLN
INTRAMUSCULAR | Status: DC | PRN
  Administered 2014-12-23: 50 ug via INTRAVENOUS
  Administered 2014-12-23: 100 ug via INTRAVENOUS
  Administered 2014-12-23: 50 ug via INTRAVENOUS

## 2014-12-23 MED ORDER — LACTATED RINGERS IV SOLN
INTRAVENOUS | Status: DC | PRN
  Administered 2014-12-23: 11:00:00 via INTRAVENOUS

## 2014-12-23 MED ORDER — LIDOCAINE-EPINEPHRINE 1 %-1:100000 IJ SOLN
INTRAMUSCULAR | Status: AC
  Filled 2014-12-23: qty 1

## 2014-12-23 MED ORDER — FENTANYL CITRATE 0.05 MG/ML IJ SOLN
INTRAMUSCULAR | Status: AC
  Filled 2014-12-23: qty 2

## 2014-12-23 MED ORDER — PIPERACILLIN-TAZOBACTAM 3.375 G IVPB
3.3750 g | Freq: Three times a day (TID) | INTRAVENOUS | Status: DC
  Administered 2014-12-23 – 2014-12-25 (×7): 3.375 g via INTRAVENOUS
  Filled 2014-12-23 (×10): qty 50

## 2014-12-23 MED ORDER — ONDANSETRON HCL 4 MG/2ML IJ SOLN
INTRAMUSCULAR | Status: DC | PRN
  Administered 2014-12-23: 4 mg via INTRAVENOUS

## 2014-12-23 MED ORDER — 0.9 % SODIUM CHLORIDE (POUR BTL) OPTIME
TOPICAL | Status: DC | PRN
  Administered 2014-12-23: 1000 mL

## 2014-12-23 MED ORDER — PROPOFOL 10 MG/ML IV BOLUS
INTRAVENOUS | Status: DC | PRN
  Administered 2014-12-23: 200 mg via INTRAVENOUS

## 2014-12-23 MED ORDER — PHENYLEPHRINE HCL 10 MG/ML IJ SOLN
INTRAMUSCULAR | Status: DC | PRN
  Administered 2014-12-23: 80 ug via INTRAVENOUS

## 2014-12-23 MED ORDER — LACTATED RINGERS IV SOLN
INTRAVENOUS | Status: DC
  Administered 2014-12-24: 01:00:00 via INTRAVENOUS

## 2014-12-23 MED ORDER — FENTANYL CITRATE 0.05 MG/ML IJ SOLN
25.0000 ug | INTRAMUSCULAR | Status: DC | PRN
  Administered 2014-12-23 (×2): 50 ug via INTRAVENOUS

## 2014-12-23 MED ORDER — LIDOCAINE-EPINEPHRINE 1 %-1:100000 IJ SOLN
INTRAMUSCULAR | Status: DC | PRN
  Administered 2014-12-23: 20 mL

## 2014-12-23 MED ORDER — MIDAZOLAM HCL 2 MG/2ML IJ SOLN
INTRAMUSCULAR | Status: AC
  Filled 2014-12-23: qty 2

## 2014-12-23 MED ORDER — LIDOCAINE HCL (CARDIAC) 20 MG/ML IV SOLN
INTRAVENOUS | Status: DC | PRN
  Administered 2014-12-23: 80 mg via INTRAVENOUS

## 2014-12-23 MED ORDER — MIDAZOLAM HCL 5 MG/5ML IJ SOLN
INTRAMUSCULAR | Status: DC | PRN
  Administered 2014-12-23: 2 mg via INTRAVENOUS

## 2014-12-23 SURGICAL SUPPLY — 56 items
ATTRACTOMAT 16X20 MAGNETIC DRP (DRAPES) IMPLANT
BNDG GAUZE ELAST 4 BULKY (GAUZE/BANDAGES/DRESSINGS) ×6 IMPLANT
CANISTER SUCTION 2500CC (MISCELLANEOUS) ×3 IMPLANT
CATH FOLEY 2WAY SLVR  5CC 14FR (CATHETERS)
CATH FOLEY 2WAY SLVR 5CC 14FR (CATHETERS) IMPLANT
CLEANER TIP ELECTROSURG 2X2 (MISCELLANEOUS) ×3 IMPLANT
CONT SPEC 4OZ CLIKSEAL STRL BL (MISCELLANEOUS) IMPLANT
CORDS BIPOLAR (ELECTRODE) IMPLANT
COVER SURGICAL LIGHT HANDLE (MISCELLANEOUS) ×3 IMPLANT
CRADLE DONUT ADULT HEAD (MISCELLANEOUS) ×3 IMPLANT
DRAIN JACKSON PRT FLT 10 (DRAIN) IMPLANT
DRAIN PENROSE 1/4X12 LTX STRL (WOUND CARE) ×3 IMPLANT
DRAIN SNY 10 ROU (WOUND CARE) IMPLANT
DRAPE ORTHO SPLIT 77X108 STRL (DRAPES)
DRAPE SURG ORHT 6 SPLT 77X108 (DRAPES) IMPLANT
ELECT COATED BLADE 2.86 ST (ELECTRODE) ×3 IMPLANT
ELECT REM PT RETURN 9FT ADLT (ELECTROSURGICAL) ×3
ELECT REM PT RETURN 9FT PED (ELECTROSURGICAL)
ELECTRODE REM PT RETRN 9FT PED (ELECTROSURGICAL) IMPLANT
ELECTRODE REM PT RTRN 9FT ADLT (ELECTROSURGICAL) ×1 IMPLANT
EVACUATOR SILICONE 100CC (DRAIN) IMPLANT
GAUZE SPONGE 4X4 12PLY STRL (GAUZE/BANDAGES/DRESSINGS) ×3 IMPLANT
GLOVE BIO SURGEON STRL SZ 6 (GLOVE) ×3 IMPLANT
GLOVE BIO SURGEON STRL SZ 6.5 (GLOVE) IMPLANT
GLOVE BIO SURGEONS STRL SZ 6.5 (GLOVE)
GLOVE BIOGEL M 7.0 STRL (GLOVE) ×6 IMPLANT
GLOVE BIOGEL PI IND STRL 7.0 (GLOVE) ×2 IMPLANT
GLOVE BIOGEL PI INDICATOR 7.0 (GLOVE) ×4
GOWN STRL REUS W/ TWL LRG LVL3 (GOWN DISPOSABLE) ×2 IMPLANT
GOWN STRL REUS W/TWL LRG LVL3 (GOWN DISPOSABLE) ×4
KIT BASIN OR (CUSTOM PROCEDURE TRAY) ×3 IMPLANT
KIT ROOM TURNOVER OR (KITS) ×3 IMPLANT
LOCATOR NERVE 3 VOLT (DISPOSABLE) IMPLANT
NEEDLE HYPO 25GX1X1/2 BEV (NEEDLE) ×3 IMPLANT
NS IRRIG 1000ML POUR BTL (IV SOLUTION) ×3 IMPLANT
PAD ARMBOARD 7.5X6 YLW CONV (MISCELLANEOUS) ×6 IMPLANT
PENCIL BUTTON HOLSTER BLD 10FT (ELECTRODE) ×3 IMPLANT
SPONGE GAUZE 4X4 12PLY STER LF (GAUZE/BANDAGES/DRESSINGS) ×3 IMPLANT
SPONGE INTESTINAL PEANUT (DISPOSABLE) ×3 IMPLANT
STAPLER VISISTAT 35W (STAPLE) ×3 IMPLANT
SUT ETHILON 3 0 PS 1 (SUTURE) IMPLANT
SUT ETHILON 5 0 PS 2 18 (SUTURE) IMPLANT
SUT SILK 2 0 FS (SUTURE) IMPLANT
SUT SILK 3 0 REEL (SUTURE) IMPLANT
SUT VIC AB 3-0 PS2 18 (SUTURE)
SUT VIC AB 3-0 PS2 18XBRD (SUTURE) IMPLANT
SUT VIC AB 4-0 P-3 18X BRD (SUTURE) IMPLANT
SUT VIC AB 4-0 P3 18 (SUTURE)
SWAB COLLECTION DEVICE MRSA (MISCELLANEOUS) ×3 IMPLANT
TAPE CLOTH SURG 4X10 WHT LF (GAUZE/BANDAGES/DRESSINGS) ×3 IMPLANT
TOWEL OR 17X24 6PK STRL BLUE (TOWEL DISPOSABLE) ×3 IMPLANT
TOWEL OR 17X26 10 PK STRL BLUE (TOWEL DISPOSABLE) ×3 IMPLANT
TRAY ENT MC OR (CUSTOM PROCEDURE TRAY) ×3 IMPLANT
TUBE ANAEROBIC SPECIMEN COL (MISCELLANEOUS) ×3 IMPLANT
TUBING SUCTION BULK 100 FT (MISCELLANEOUS) IMPLANT
WATER STERILE IRR 1000ML POUR (IV SOLUTION) ×3 IMPLANT

## 2014-12-23 NOTE — Transfer of Care (Signed)
Immediate Anesthesia Transfer of Care Note  Patient: Desiree Haneiara S Sachdev  Procedure(s) Performed: Procedure(s): incision and drainage right neck abscess (Right)  Patient Location: PACU  Anesthesia Type:General  Level of Consciousness: awake, alert , oriented and patient cooperative  Airway & Oxygen Therapy: Patient Spontanous Breathing  Post-op Assessment: Report given to RN, Post -op Vital signs reviewed and stable and Patient moving all extremities  Post vital signs: Reviewed and stable  Last Vitals:  Filed Vitals:   12/23/14 0912  BP: 104/57  Pulse: 81  Temp: 36.9 C  Resp: 18    Complications: No apparent anesthesia complications

## 2014-12-23 NOTE — Anesthesia Preprocedure Evaluation (Addendum)
Anesthesia Evaluation  Patient identified by MRN, date of birth, ID band Patient awake    Reviewed: Allergy & Precautions, NPO status , Patient's Chart, lab work & pertinent test results  Airway Mallampati: II  TM Distance: >3 FB Neck ROM: Full    Dental no notable dental hx.    Pulmonary Current Smoker,  breath sounds clear to auscultation  Pulmonary exam normal       Cardiovascular negative cardio ROS  Rhythm:Regular Rate:Normal     Neuro/Psych negative neurological ROS  negative psych ROS   GI/Hepatic Neg liver ROS, Ulcerative colitis   Endo/Other  Morbid obesity  Renal/GU negative Renal ROS  negative genitourinary   Musculoskeletal negative musculoskeletal ROS (+)   Abdominal   Peds negative pediatric ROS (+)  Hematology negative hematology ROS (+) 12/38   Anesthesia Other Findings   Reproductive/Obstetrics negative OB ROS                            Anesthesia Physical Anesthesia Plan  ASA: II  Anesthesia Plan: General   Post-op Pain Management:    Induction: Intravenous  Airway Management Planned: Oral ETT  Additional Equipment:   Intra-op Plan:   Post-operative Plan: Extubation in OR  Informed Consent: I have reviewed the patients History and Physical, chart, labs and discussed the procedure including the risks, benefits and alternatives for the proposed anesthesia with the patient or authorized representative who has indicated his/her understanding and acceptance.   Dental advisory given  Plan Discussed with: CRNA and Surgeon  Anesthesia Plan Comments: (Need PG test)       Anesthesia Quick Evaluation

## 2014-12-23 NOTE — Anesthesia Postprocedure Evaluation (Signed)
  Anesthesia Post-op Note  Patient: Alexandra Ramsey  Procedure(s) Performed: Procedure(s) (LRB): incision and drainage right neck abscess (Right)  Patient Location: PACU  Anesthesia Type: General  Level of Consciousness: awake and alert   Airway and Oxygen Therapy: Patient Spontanous Breathing  Post-op Pain: mild  Post-op Assessment: Post-op Vital signs reviewed, Patient's Cardiovascular Status Stable, Respiratory Function Stable, Patent Airway and No signs of Nausea or vomiting  Last Vitals:  Filed Vitals:   12/23/14 0912  BP: 104/57  Pulse: 81  Temp: 36.9 C  Resp: 18    Post-op Vital Signs: stable   Complications: No apparent anesthesia complications

## 2014-12-23 NOTE — Progress Notes (Signed)
Patient present back on floor from PACU. Patient stable. Family at bedside.

## 2014-12-23 NOTE — Brief Op Note (Signed)
12/22/2014 - 12/23/2014  11:20 AM  PATIENT:  Alexandra Ramsey  24 y.o. female  PRE-OPERATIVE DIAGNOSIS:  right neck abscess  POST-OPERATIVE DIAGNOSIS:  right neck abscess  PROCEDURE:  Procedure(s): incision and drainage right neck abscess (Right)  SURGEON:  Surgeon(s) and Role:    * Osborn Cohoavid Jakia Kennebrew, MD - Primary  PHYSICIAN ASSISTANT:   ASSISTANTS: none   ANESTHESIA:   general  EBL:   Min  BLOOD ADMINISTERED:none  DRAINS: Penrose drain in the Rt neck   LOCAL MEDICATIONS USED:  LIDOCAINE  and Amount: 1 ml  SPECIMEN:  No Specimen  DISPOSITION OF SPECIMEN:  N/A  COUNTS:  YES  TOURNIQUET:  * No tourniquets in log *  DICTATION: .Other Dictation: Dictation Number [id number]  PLAN OF CARE: Admit to inpatient   PATIENT DISPOSITION:  PACU - hemodynamically stable.   Delay start of Pharmacological VTE agent (>24hrs) due to surgical blood loss or risk of bleeding: no

## 2014-12-23 NOTE — Consult Note (Signed)
ENT CONSULT:  Reason for Consult:Right neck abscess Referring Physician: Med Svc  Alexandra Ramsey is an 24 y.o. female.  HPI: Pt with sev day hx of prg swelling of Rt neck, erythema after infection of earlobe  Past Medical History  Diagnosis Date  . Ulcerative colitis 05/29/08 & 04/29/10    Colonoscopy with biopsy Dx'd  . Cellulitis and abscess of neck 12/22/2014    right  . History of blood transfusion     "related to ulcerative colitis"    Past Surgical History  Procedure Laterality Date  . Colonoscopy w/ biopsies  05/29/08 and 05/08/10    Biopsy consistent with colitis  . Tonsillectomy and adenoidectomy  ~ 2001    Family History  Problem Relation Age of Onset  . Colon polyps Father   . Hypertension Father   . Colon polyps Maternal Grandmother   . Colon polyps Paternal Grandmother   . Inflammatory bowel disease Neg Hx   . Hypertension Mother     Social History:  reports that she has been smoking Cigarettes.  She has a 3.5 pack-year smoking history. She has never used smokeless tobacco. She reports that she drinks about 1.2 oz of alcohol per week. She reports that she uses illicit drugs (Marijuana) about 3 times per week.  Allergies:  Allergies  Allergen Reactions  . Ibuprofen Other (See Comments)    Can't take due to UC    Medications: I have reviewed the patient's current medications.  Results for orders placed or performed during the hospital encounter of 12/22/14 (from the past 48 hour(s))  CBC with Differential/Platelet     Status: Abnormal   Collection Time: 12/22/14 12:57 PM  Result Value Ref Range   WBC 11.0 (H) 4.0 - 10.5 K/uL   RBC 4.75 3.87 - 5.11 MIL/uL   Hemoglobin 12.9 12.0 - 15.0 g/dL   HCT 38.6 36.0 - 46.0 %   MCV 81.3 78.0 - 100.0 fL   MCH 27.2 26.0 - 34.0 pg   MCHC 33.4 30.0 - 36.0 g/dL   RDW 14.0 11.5 - 15.5 %   Platelets 238 150 - 400 K/uL   Neutrophils Relative % 79 (H) 43 - 77 %   Neutro Abs 8.8 (H) 1.7 - 7.7 K/uL   Lymphocytes Relative 11  (L) 12 - 46 %   Lymphs Abs 1.2 0.7 - 4.0 K/uL   Monocytes Relative 8 3 - 12 %   Monocytes Absolute 0.9 0.1 - 1.0 K/uL   Eosinophils Relative 2 0 - 5 %   Eosinophils Absolute 0.2 0.0 - 0.7 K/uL   Basophils Relative 0 0 - 1 %   Basophils Absolute 0.0 0.0 - 0.1 K/uL  I-stat chem 8, ed     Status: Abnormal   Collection Time: 12/22/14  1:38 PM  Result Value Ref Range   Sodium 138 135 - 145 mmol/L   Potassium 3.9 3.5 - 5.1 mmol/L   Chloride 103 96 - 112 mmol/L   BUN 3 (L) 6 - 23 mg/dL   Creatinine, Ser 0.60 0.50 - 1.10 mg/dL   Glucose, Bld 83 70 - 99 mg/dL   Calcium, Ion 1.15 1.12 - 1.23 mmol/L   TCO2 21 0 - 100 mmol/L   Hemoglobin 15.3 (H) 12.0 - 15.0 g/dL   HCT 45.0 36.0 - 46.0 %  I-Stat CG4 Lactic Acid, ED     Status: None   Collection Time: 12/22/14  1:42 PM  Result Value Ref Range   Lactic Acid, Venous  1.03 0.5 - 2.0 mmol/L  I-Stat CG4 Lactic Acid, ED     Status: None   Collection Time: 12/22/14  4:02 PM  Result Value Ref Range   Lactic Acid, Venous 1.02 0.5 - 2.0 mmol/L  POC urine preg, ED (not at [facility])     Status: None   Collection Time: 12/22/14  4:25 PM  Result Value Ref Range   Preg Test, Ur NEGATIVE NEGATIVE    Comment:        THE SENSITIVITY OF THIS METHODOLOGY IS >24 mIU/mL   Protime-INR     Status: None   Collection Time: 12/22/14  7:15 PM  Result Value Ref Range   Prothrombin Time 15.0 11.6 - 15.2 seconds   INR 1.16 0.00 - 1.49  TSH     Status: None   Collection Time: 12/22/14  7:15 PM  Result Value Ref Range   TSH 2.927 0.350 - 4.500 uIU/mL  Comprehensive metabolic panel     Status: Abnormal (Preliminary result)   Collection Time: 12/23/14  5:00 AM  Result Value Ref Range   Sodium PENDING 135 - 145 mmol/L   Potassium 4.2 3.5 - 5.1 mmol/L   Chloride PENDING 96 - 112 mmol/L   CO2 PENDING 19 - 32 mmol/L   Glucose, Bld 79 70 - 99 mg/dL   BUN 7 6 - 23 mg/dL   Creatinine, Ser 0.72 0.50 - 1.10 mg/dL   Calcium 8.2 (L) 8.4 - 10.5 mg/dL   Total Protein 6.2 6.0  - 8.3 g/dL   Albumin 3.1 (L) 3.5 - 5.2 g/dL   AST 13 0 - 37 U/L   ALT 10 0 - 35 U/L   Alkaline Phosphatase 59 39 - 117 U/L   Total Bilirubin 0.5 0.3 - 1.2 mg/dL   GFR calc non Af Amer >90 >90 mL/min   GFR calc Af Amer >90 >90 mL/min    Comment: (NOTE) The eGFR has been calculated using the CKD EPI equation. This calculation has not been validated in all clinical situations. eGFR's persistently <90 mL/min signify possible Chronic Kidney Disease.    Anion gap PENDING 5 - 15    Ct Soft Tissue Neck W Contrast  12/22/2014   CLINICAL DATA:  Acute neck pain and swelling for 2 days.  EXAM: CT NECK WITH CONTRAST  TECHNIQUE: Multidetector CT imaging of the neck was performed using the standard protocol following the bolus administration of intravenous contrast.  CONTRAST:  36m OMNIPAQUE IOHEXOL 300 MG/ML  SOLN  COMPARISON:  None.  FINDINGS: Pharynx and larynx: Normal.  Salivary glands: Left parotid gland and submandibular glands appear normal. 17 x 16 mm fluid collection is seen either within the posterior portion of the right parotid gland or immediately posterior to it anterior to the sternocleidomastoid muscle.  Thyroid: Normal.  Lymph nodes: Multiple enlarged lymph nodes are noted on the right side at the level 2 and level 3 regions. The largest measures 14 x 11 mm. There is some associated free fluid in the soft tissues anterior to the right sternocleidomastoid muscle and posterior to the right submandibular gland.  Vascular: Visualized vasculature appears normal.  Limited intracranial: No definite abnormality is seen.  Visualized orbits: No definite abnormality is seen.  Mastoids and visualized paranasal sinuses: No abnormality seen.  Skeleton: No abnormality seen.  Upper chest: No definite abnormality seen.  IMPRESSION: 17 x 16 mm fluid collection consistent with abscess is seen either within the posterior portion of the right parotid gland, or immediately posterior  to it. There are surrounding  inflammatory changes in the subcutaneous tissues consistent with associated cellulitis, as well as a mild amount of associated fluid between the right sternocleidomastoid muscle and right submandibular gland. Also noted are multiple enlarged lymph nodes deep to the right sternocleidomastoid muscle most likely inflammatory in origin.   Electronically Signed   By: Marijo Conception, M.D.   On: 12/22/2014 14:42    ROS:ROS  Blood pressure 102/58, pulse 75, temperature 98.1 F (36.7 C), temperature source Oral, resp. rate 17, height _0  (1.651 m), weight 108.863 kg (240 lb), last menstrual period 12/04/2014, SpO2 100 %.  PHYSICAL EXAM: General appearance - alert, well appearing, and in no distress Ears - bilateral TM's and external ear canals normal, eruthema earlobe Neck - swelling and erythema, right neck  Studies Reviewed:CT  Assessment/Plan: Rec I&D in OR this am. Cont abx.  [place], Elliannah Wayment 12/23/2014, 8:43 AM

## 2014-12-23 NOTE — Progress Notes (Signed)
[facility] PROGRESS NOTE  Alexandra Ramsey:[id number] DOB: 06-24-1991 DOA: 12/22/2014 PCP: Dorrene German, MD  Summary I have seen and examined Alexandra Ramsey at bedside in the presence of her father and mother and reviewed her chart. Appreciate Otolaryngology.Alexandra Ramsey is a pleasant 24 y.o. female with past medical history of chronic colitis, (diagnosed at some point as ulcerative colitis and she was on Imuran and prednisone) who came in to the hospital complaining of neck swelling not responding to Augmentin been given in the outpatient treatment. CT of the neck showed "17 x 16 mm fluid collection consistent with abscess is seen either within the posterior portion of the right parotid gland, or immediately posterior to it. There are surrounding inflammatory changes in the subcutaneous tissues consistent with associated cellulitis, as well as a mild amount of associated fluid between the right sternocleidomastoid muscle and right submandibular gland. Also noted are multiple enlarged lymph nodes deep to the right sternocleidomastoid muscle most likely inflammatory in origin". She therefore had incision and drainage none today. Cultures are pending. She is on Zosyn. Will continue current antibiotics pending final culture results and direction from Otolaryngology.  Plan Cellulitis and abscess of neck  Follow wound cultures and adjust antibiotics accordingly  Continue Zosyn  Follow ENT recommendations. Obesity (BMI 30-39.9)  No acute changes Code Status: Full Code. Family Communication: Father and mother at bedside. Disposition Plan: Eventually home   Consultants:  ENT  Procedures:  I&D  Antibiotics:  Zosyn 12/22/14>  HPI/Subjective: No complaints. Hungry.  Objective: Filed Vitals:   12/23/14 1254  BP: 99/46  Pulse: 70  Temp: 97.7 F (36.5 C)  Resp: 16    Intake/Output Summary (Last 24 hours) at 12/23/14 1308 Last data filed at 12/23/14 1125  Gross per 24  hour  Intake 2116.25 ml  Output      0 ml  Net 2116.25 ml   Filed Weights   12/22/14 1725  Weight: 108.863 kg (240 lb)    Exam:   General:  Comfortable at rest. No drainage from right neck wound.  Cardiovascular: S1-S2 normal. No murmurs. Pulse regular.  Respiratory: Good air entry bilaterally. No rhonchi or rales.  Abdomen: Soft and nontender. Normal bowel sounds. No organomegaly.  Musculoskeletal: No pedal edema   Neurological: Intact  Data Reviewed: Basic Metabolic Panel:  Recent Labs Lab 12/22/14 1338 12/23/14 0500  NA 138 138  K 3.9 4.2  CL 103 105  CO2  --  24  GLUCOSE 83 79  BUN 3* 7  CREATININE 0.60 0.72  CALCIUM  --  8.2*   Liver Function Tests:  Recent Labs Lab 12/23/14 0500  AST 13  ALT 10  ALKPHOS 59  BILITOT 0.5  PROT 6.2  ALBUMIN 3.1*   No results for input(s): LIPASE, AMYLASE in the last 168 hours. No results for input(s): AMMONIA in the last 168 hours. CBC:  Recent Labs Lab 12/22/14 1257 12/22/14 1338  WBC 11.0*  --   NEUTROABS 8.8*  --   HGB 12.9 15.3*  HCT 38.6 45.0  MCV 81.3  --   PLT 238  --    Cardiac Enzymes: No results for input(s): CKTOTAL, CKMB, CKMBINDEX, TROPONINI in the last 168 hours. BNP (last 3 results) No results for input(s): BNP in the last 8760 hours.  ProBNP (last 3 results) No results for input(s): PROBNP in the last 8760 hours.  CBG: No results for input(s): GLUCAP in the last 168 hours.  No results found for this or  any previous visit (from the past 240 hour(s)).   Studies: Ct Soft Tissue Neck W Contrast  12/22/2014   CLINICAL DATA:  Acute neck pain and swelling for 2 days.  EXAM: CT NECK WITH CONTRAST  TECHNIQUE: Multidetector CT imaging of the neck was performed using the standard protocol following the bolus administration of intravenous contrast.  CONTRAST:  75mL OMNIPAQUE IOHEXOL 300 MG/ML  SOLN  COMPARISON:  None.  FINDINGS: Pharynx and larynx: Normal.  Salivary glands: Left parotid gland  and submandibular glands appear normal. 17 x 16 mm fluid collection is seen either within the posterior portion of the right parotid gland or immediately posterior to it anterior to the sternocleidomastoid muscle.  Thyroid: Normal.  Lymph nodes: Multiple enlarged lymph nodes are noted on the right side at the level 2 and level 3 regions. The largest measures 14 x 11 mm. There is some associated free fluid in the soft tissues anterior to the right sternocleidomastoid muscle and posterior to the right submandibular gland.  Vascular: Visualized vasculature appears normal.  Limited intracranial: No definite abnormality is seen.  Visualized orbits: No definite abnormality is seen.  Mastoids and visualized paranasal sinuses: No abnormality seen.  Skeleton: No abnormality seen.  Upper chest: No definite abnormality seen.  IMPRESSION: 17 x 16 mm fluid collection consistent with abscess is seen either within the posterior portion of the right parotid gland, or immediately posterior to it. There are surrounding inflammatory changes in the subcutaneous tissues consistent with associated cellulitis, as well as a mild amount of associated fluid between the right sternocleidomastoid muscle and right submandibular gland. Also noted are multiple enlarged lymph nodes deep to the right sternocleidomastoid muscle most likely inflammatory in origin.   Electronically Signed   By: Lupita RaiderJames  Green Jr, M.D.   On: 12/22/2014 14:42    Scheduled Meds: . fentaNYL      . heparin  5,000 Units Subcutaneous 3 times per day  . piperacillin-tazobactam (ZOSYN)  IV  3.375 g Intravenous 3 times per day  . sodium chloride  3 mL Intravenous Q12H   Continuous Infusions: . sodium chloride 75 mL/hr at 12/23/14 0316  . lactated ringers       Time spent: 15 minutes    Claron Rosencrans  [facility] Pager [phone]. If 7PM-7AM, please contact night-coverage at [email or web address], password [email or web address] 12/23/2014, 1:08 PM  LOS: 1 day

## 2014-12-24 ENCOUNTER — Encounter (HOSPITAL_COMMUNITY): Payer: Self-pay | Admitting: Otolaryngology

## 2014-12-24 LAB — CBC
HEMATOCRIT: 33.5 % — AB (ref 36.0–46.0)
HEMOGLOBIN: 11 g/dL — AB (ref 12.0–15.0)
MCH: 26.9 pg (ref 26.0–34.0)
MCHC: 32.8 g/dL (ref 30.0–36.0)
MCV: 81.9 fL (ref 78.0–100.0)
Platelets: 258 10*3/uL (ref 150–400)
RBC: 4.09 MIL/uL (ref 3.87–5.11)
RDW: 14.1 % (ref 11.5–15.5)
WBC: 11.4 10*3/uL — ABNORMAL HIGH (ref 4.0–10.5)

## 2014-12-24 LAB — BASIC METABOLIC PANEL
ANION GAP: 11 (ref 5–15)
BUN: 6 mg/dL (ref 6–23)
CHLORIDE: 104 mmol/L (ref 96–112)
CO2: 23 mmol/L (ref 19–32)
CREATININE: 0.67 mg/dL (ref 0.50–1.10)
Calcium: 8.9 mg/dL (ref 8.4–10.5)
GFR calc non Af Amer: >90 mL/min (ref >90)
Glucose, Bld: 108 mg/dL — ABNORMAL HIGH (ref 70–99)
POTASSIUM: 4.1 mmol/L (ref 3.5–5.1)
Sodium: 138 mmol/L (ref 135–145)

## 2014-12-24 LAB — HEMOGLOBIN A1C
Hgb A1c MFr Bld: 4.9 % (ref 4.8–5.6)
Mean Plasma Glucose: 94 mg/dL

## 2014-12-24 MED ORDER — VANCOMYCIN HCL IN DEXTROSE 1-5 GM/200ML-% IV SOLN
1000.0000 mg | Freq: Three times a day (TID) | INTRAVENOUS | Status: DC
  Administered 2014-12-24 – 2014-12-25 (×3): 1000 mg via INTRAVENOUS
  Filled 2014-12-24 (×6): qty 200

## 2014-12-24 MED ORDER — HYDROCODONE-ACETAMINOPHEN 7.5-325 MG/15ML PO SOLN
10.0000 mL | ORAL | Status: DC | PRN
  Administered 2014-12-24 – 2014-12-25 (×4): 15 mL via ORAL
  Filled 2014-12-24 (×4): qty 15

## 2014-12-24 NOTE — Progress Notes (Signed)
ANTIBIOTIC CONSULT NOTE - INITIAL  Pharmacy Consult for vancomycin Indication: cellulitis/abscess s/p I&D  Allergies  Allergen Reactions  . Ibuprofen Other (See Comments)    Can't take due to UC    Patient Measurements: Height:  (165.1 cm) Weight: 240 lb (108.863 kg) IBW/kg (Calculated) : 57 Adjusted Body Weight: 77.5kg  Vital Signs: Temp: 98.1 F (36.7 C) (02/25 0530) Temp Source: Oral (02/25 0530) BP: 94/47 mmHg (02/25 0530) Pulse Rate: 73 (02/25 0530) Intake/Output from previous day: 02/24 0701 - 02/25 0700 In: 1111.8 [P.O.:360; I.V.:551.8; IV Piggyback:200] Out: -  Intake/Output from this shift:    Labs:  Recent Labs  12/22/14 1257 12/22/14 1338 12/23/14 0500 12/24/14 0458  WBC 11.0*  --   --  11.4*  HGB 12.9 15.3*  --  11.0*  PLT 238  --   --  258  CREATININE  --  0.60 0.72 0.67   Estimated Creatinine Clearance: 136.6 mL/min (by C-G formula based on Cr of 0.67). No results for input(s): VANCOTROUGH, VANCOPEAK, VANCORANDOM, GENTTROUGH, GENTPEAK, GENTRANDOM, TOBRATROUGH, TOBRAPEAK, TOBRARND, AMIKACINPEAK, AMIKACINTROU, AMIKACIN in the last 72 hours.   Microbiology: Recent Results (from the past 720 hour(s))  Surgical pcr screen     Status: Abnormal   Collection Time: 12/23/14  9:46 AM  Result Value Ref Range Status   MRSA, PCR NEGATIVE NEGATIVE Final   Staphylococcus aureus POSITIVE (A) NEGATIVE Final    Comment:        The Xpert SA Assay (FDA approved for NASAL specimens in patients over 22 years of age), is one component of a comprehensive surveillance program.  Test performance has been validated by [facility] for patients greater than or equal to 27 year old. It is not intended to diagnose infection nor to guide or monitor treatment.   Anaerobic culture     Status: None (Preliminary result)   Collection Time: 12/23/14 11:09 AM  Result Value Ref Range Status   Specimen Description ABSCESS NECK RIGHT  Final   Special Requests NONE   Final   Gram Stain   Final    MODERATE WBC PRESENT, PREDOMINANTLY PMN NO SQUAMOUS EPITHELIAL CELLS SEEN NO ORGANISMS SEEN Performed at [facility]    Culture PENDING  Incomplete   Report Status PENDING  Incomplete  Culture, routine-abscess     Status: None (Preliminary result)   Collection Time: 12/23/14 11:09 AM  Result Value Ref Range Status   Specimen Description ABSCESS NECK RIGHT  Final   Special Requests NONE  Final   Gram Stain   Final    MODERATE WBC PRESENT, PREDOMINANTLY PMN NO SQUAMOUS EPITHELIAL CELLS SEEN NO ORGANISMS SEEN Performed at [facility]    Culture   Final    NO GROWTH 1 DAY Performed at [facility]    Report Status PENDING  Incomplete   Assessment: 24 YOF brought in with R ear lobe cellulitis and right neck abscess now s/p I&D. Per ENT this morning, patient is improving. She is currently on Zosyn. [facility] asked to start vancomycin d/t increase in WBC (11>11.4) and continued pain. She was MRSA positive on nasal swab, but cultures from abscess taken during I&D remain NGTD.  SCr 0.67, normalized CrCl >131mL/min.  Goal of Therapy:  Vancomycin trough level 15-20 mcg/ml  Plan:  -continue Zosyn 3.375g IV q8h (pharmacy has already signed off as this will not require dose adjustment in young, healthy female) -vancomycin  IV q8h per obesity nomogram -follow clinical progression, c/s, ability to narrow  antibiotics, renal function, trough PRN  Macarius Ruark D. Kierstynn Babich, PharmD, BCPS Clinical Pharmacist Pager: [phone] 12/24/2014 12:07 PM

## 2014-12-24 NOTE — Progress Notes (Signed)
   ENT Progress Note: POD #1 s/p Procedure(s): incision and drainage right neck abscess   Subjective: Mod pain, improving  Objective: Vital signs in last 24 hours: Temp:  [97.7 F (36.5 C)-98.4 F (36.9 C)] 98.1 F (36.7 C) (02/25 0530) Pulse Rate:  [62-86] 73 (02/25 0530) Resp:  [11-21] 16 (02/25 0530) BP: (87-110)/(46-67) 94/47 mmHg (02/25 0530) SpO2:  [89 %-100 %] 98 % (02/25 0530) Weight change:     Intake/Output from previous day: 02/24 0701 - 02/25 0700 In: 1111.8 [P.O.:360; I.V.:551.8; IV Piggyback:200] Out: -  Intake/Output this shift:    Labs:  Recent Labs  12/22/14 1257 12/22/14 1338 12/24/14 0458  WBC 11.0*  --  11.4*  HGB 12.9 15.3* 11.0*  HCT 38.6 45.0 33.5*  PLT 238  --  258    Recent Labs  12/23/14 0500 12/24/14 0458  NA 138 138  K 4.2 4.1  CL 105 104  CO2 24 23  GLUCOSE 79 108*  BUN 7 6  CALCIUM 8.2* 8.9    Studies/Results: Ct Soft Tissue Neck W Contrast  12/22/2014   CLINICAL DATA:  Acute neck pain and swelling for 2 days.  EXAM: CT NECK WITH CONTRAST  TECHNIQUE: Multidetector CT imaging of the neck was performed using the standard protocol following the bolus administration of intravenous contrast.  CONTRAST:  75mL OMNIPAQUE IOHEXOL 300 MG/ML  SOLN  COMPARISON:  None.  FINDINGS: Pharynx and larynx: Normal.  Salivary glands: Left parotid gland and submandibular glands appear normal. 17 x 16 mm fluid collection is seen either within the posterior portion of the right parotid gland or immediately posterior to it anterior to the sternocleidomastoid muscle.  Thyroid: Normal.  Lymph nodes: Multiple enlarged lymph nodes are noted on the right side at the level 2 and level 3 regions. The largest measures 14 x 11 mm. There is some associated free fluid in the soft tissues anterior to the right sternocleidomastoid muscle and posterior to the right submandibular gland.  Vascular: Visualized vasculature appears normal.  Limited intracranial: No definite  abnormality is seen.  Visualized orbits: No definite abnormality is seen.  Mastoids and visualized paranasal sinuses: No abnormality seen.  Skeleton: No abnormality seen.  Upper chest: No definite abnormality seen.  IMPRESSION: 17 x 16 mm fluid collection consistent with abscess is seen either within the posterior portion of the right parotid gland, or immediately posterior to it. There are surrounding inflammatory changes in the subcutaneous tissues consistent with associated cellulitis, as well as a mild amount of associated fluid between the right sternocleidomastoid muscle and right submandibular gland. Also noted are multiple enlarged lymph nodes deep to the right sternocleidomastoid muscle most likely inflammatory in origin.   Electronically Signed   By: Lupita RaiderJames  Green Jr, M.D.   On: 12/22/2014 14:42     PHYSICAL EXAM: Drain in-place, min d/c Decreasing erythema   Assessment/Plan: Improving with current tx after I&D Bid dressing changes Cont current IV abx and pain meds Anticipate d/c on 2/26/or 2/27 based on cont clinical improvement.    Paris Hohn 12/24/2014, 9:10 AM

## 2014-12-24 NOTE — Progress Notes (Signed)
[facility] PROGRESS NOTE  Alexandra Ramsey:[id number] DOB: 10-10-1991 DOA: 12/22/2014 PCP: Dorrene German, MD  Summary I have seen and examined Alexandra Ramsey at bedside and reviewed her chart. Appreciate Otolaryngology.Alexandra Ramsey is a pleasant 24 y.o. female with past medical history of chronic colitis, (diagnosed at some point as ulcerative colitis and she was on Imuran and prednisone) who came in to the hospital complaining of neck swelling not responding to Augmentin being given in the outpatient setting. CT of the neck showed "17 x 16 mm fluid collection consistent with abscess is seen either within the posterior portion of the right parotid gland, or immediately posterior to it. There are surrounding inflammatory changes in the subcutaneous tissues consistent with associated cellulitis, as well as a mild amount of associated fluid between the right sternocleidomastoid muscle and right submandibular gland. Also noted are multiple enlarged lymph nodes deep to the right sternocleidomastoid muscle most likely inflammatory in origin". She therefore had incision and drainage on 12/23/14. Cultures are pending- nasal swab positive for staph aureus(not MRSA). She is on Zosyn but wbc remains at 11400. Will add Vancomycin pending final wound culture results and direction from Otolaryngology.  Plan Cellulitis and abscess of neck  Follow wound cultures and adjust antibiotics accordingly  Continue Zosyn, Add Vancomycin(staph aureus? From nasal swab, and wbc still high)  Follow ENT recommendations. Obesity (BMI 30-39.9)  No acute changes Code Status: Full Code. Family Communication: Father and mother at bedside. Disposition Plan: Eventually home   Consultants:  ENT  Procedures:  I&D  Antibiotics:  Zosyn 12/22/14>  Vancomycin 12/24/14>  HPI/Subjective: No complaints. Hungry  Objective: Filed Vitals:   12/24/14 0530  BP: 94/47  Pulse: 73  Temp: 98.1 F (36.7 C)  Resp: 16     Intake/Output Summary (Last 24 hours) at 12/24/14 1609 Last data filed at 12/24/14 1300  Gross per 24 hour  Intake 851.83 ml  Output      0 ml  Net 851.83 ml   Filed Weights   12/22/14 1725  Weight: 108.863 kg (240 lb)    Exam:   General:  Comfortable at rest. Some drainage from right neck wound.  Cardiovascular: S1-S2 normal. No murmurs. Pulse regular.  Respiratory: Good air entry bilaterally. No rhonchi or rales.  Abdomen: Soft and nontender. Normal bowel sounds. No organomegaly.  Musculoskeletal: No pedal edema   Neurological: Intact  Data Reviewed: Basic Metabolic Panel:  Recent Labs Lab 12/22/14 1338 12/23/14 0500 12/24/14 0458  NA 138 138 138  K 3.9 4.2 4.1  CL 103 105 104  CO2  --  24 23  GLUCOSE 83 79 108*  BUN 3* 7 6  CREATININE 0.60 0.72 0.67  CALCIUM  --  8.2* 8.9   Liver Function Tests:  Recent Labs Lab 12/23/14 0500  AST 13  ALT 10  ALKPHOS 59  BILITOT 0.5  PROT 6.2  ALBUMIN 3.1*   No results for input(s): LIPASE, AMYLASE in the last 168 hours. No results for input(s): AMMONIA in the last 168 hours. CBC:  Recent Labs Lab 12/22/14 1257 12/22/14 1338 12/24/14 0458  WBC 11.0*  --  11.4*  NEUTROABS 8.8*  --   --   HGB 12.9 15.3* 11.0*  HCT 38.6 45.0 33.5*  MCV 81.3  --  81.9  PLT 238  --  258   Cardiac Enzymes: No results for input(s): CKTOTAL, CKMB, CKMBINDEX, TROPONINI in the last 168 hours. BNP (last 3 results) No results for input(s): BNP in the  last 8760 hours.  ProBNP (last 3 results) No results for input(s): PROBNP in the last 8760 hours.  CBG: No results for input(s): GLUCAP in the last 168 hours.  Recent Results (from the past 240 hour(s))  Surgical pcr screen     Status: Abnormal   Collection Time: 12/23/14  9:46 AM  Result Value Ref Range Status   MRSA, PCR NEGATIVE NEGATIVE Final   Staphylococcus aureus POSITIVE (A) NEGATIVE Final    Comment:        The Xpert SA Assay (FDA approved for NASAL  specimens in patients over 421 years of age), is one component of a comprehensive surveillance program.  Test performance has been validated by [facility] for patients greater than or equal to 22 year old. It is not intended to diagnose infection nor to guide or monitor treatment.   Anaerobic culture     Status: None (Preliminary result)   Collection Time: 12/23/14 11:09 AM  Result Value Ref Range Status   Specimen Description ABSCESS NECK RIGHT  Final   Special Requests NONE  Final   Gram Stain   Final    MODERATE WBC PRESENT, PREDOMINANTLY PMN NO SQUAMOUS EPITHELIAL CELLS SEEN NO ORGANISMS SEEN Performed at [facility]    Culture   Final    NO ANAEROBES ISOLATED; CULTURE IN PROGRESS FOR 5 DAYS Performed at [facility]    Report Status PENDING  Incomplete  Culture, routine-abscess     Status: None (Preliminary result)   Collection Time: 12/23/14 11:09 AM  Result Value Ref Range Status   Specimen Description ABSCESS NECK RIGHT  Final   Special Requests NONE  Final   Gram Stain   Final    MODERATE WBC PRESENT, PREDOMINANTLY PMN NO SQUAMOUS EPITHELIAL CELLS SEEN NO ORGANISMS SEEN Performed at [facility]    Culture   Final    NO GROWTH 1 DAY Performed at [facility]    Report Status PENDING  Incomplete     Studies: No results found.  Scheduled Meds: . heparin  5,000 Units Subcutaneous 3 times per day  . piperacillin-tazobactam (ZOSYN)  IV  3.375 g Intravenous 3 times per day  . sodium chloride  3 mL Intravenous Q12H  . vancomycin  1,000 mg Intravenous Q8H   Continuous Infusions: . lactated ringers 10 mL/hr at 12/24/14 0031     Time spent: 25 minutes    Shakeel Disney  [facility] Pager [phone]. If 7PM-7AM, please contact night-coverage at [email or web address], password [email or web address] 12/24/2014, 4:09 PM  LOS: 2 days

## 2014-12-24 NOTE — Op Note (Signed)
NAME:  Alexandra Ramsey, Alexandra Ramsey NO.:  [id number]  MEDICAL RECORD NO.:  [id number]  LOCATION:                                 FACILITY:  PHYSICIAN:  Kinnie Scales. Annalee Genta, M.D.DATE OF BIRTH:  03-18-1991  DATE OF PROCEDURE:  12/23/2014 DATE OF DISCHARGE:                              OPERATIVE REPORT   LOCATION:  [facility] Main OR.  PREOPERATIVE DIAGNOSIS:  Right deep neck abscess.  POSTOPERATIVE DIAGNOSIS:  Right deep neck abscess.  INDICATIONS FOR SURGERY:  Right deep neck abscess.  PROCEDURE:  Incision and drainage of right deep neck abscess.  ANESTHESIA:  General endotracheal.  SURGEON:  Kinnie Scales. Annalee Genta, MD.  COMPLICATIONS:  None.  ESTIMATED BLOOD LOSS:  Minimal.  The patient was transferred from operating room to recovery room in stable condition.  BRIEF HISTORY:  The patient is a 24 year old black female, who was admitted to the [facility] Emergency Department with pain, redness, and swelling of the right lateral neck.  She developed symptoms approximately 3 days prior with infection of the right earlobe and cellulitic changes in the neck.  She was seen in an urgent care facility and started on clindamycin, which she took on a consistent basis. Unfortunately her symptoms progressed, and she began to develop increasing pain, redness, and swelling in the right superior lateral neck.  Evaluation in the emergency room including a CT scan showed significant soft tissue infection, lymphadenopathy, and a fluid collection in the superior lateral neck adjacent to the parotid gland. Findings consistent with a deep neck abscess.  The patient was admitted to the hospital and started on broad-spectrum intravenous antibiotics. She was seen on the first postoperative morning with some limited clinical improvement but continued swelling and pain.  Given her history and findings, I recommended incision and drainage of the abscess under general  anesthesia.  The risks and benefits were discussed with the patient and her family and they understood and concurred with our plan for surgery which is scheduled on elective basis at [facility] Main OR on 12/23/2014.  DESCRIPTION OF PROCEDURE:  The patient was brought to the operating room, placed in supine position on the operating table.  General endotracheal anesthesia was established without difficulty.  The patient was adequately anesthetized, she was positioned and prepped and draped. Her skin was injected with 1 mL of 1% lidocaine 1:100,000 solution of epinephrine injected in the preexisting skin crease in the right lateral neck overlying the area of maximal swelling.  The patient was then prepped, draped, and prepared for surgery.  With the patient prepared, a 15 scalpel was used to make a 1 cm curvilinear incision in the lateral neck skin.  This was carried through the subcutaneous tissue.  A hemostat was then used to gently probe the soft tissue dissecting along the anterior border of sternocleidomastoid muscle.  There was significant fullness, which was dissected and moderate amount of thick purulent material was expressed from the wound. The area was then  irrigated and suctioned.  No further infection or pus noted.  A quarter-inch Penrose drain was placed at the depth of the dissection and sutured to the anterior neck skin with  a 3-0 Ethilon suture.  The patient's wound was then dressed with 4 x 4 gauze  and a Kerlix wrap.  She was awakened from anesthetic, extubated, and transferred from the operating room to the recovery room in stable condition.  There were no complications and no blood loss.          ______________________________ Kinnie Scalesavid L. Annalee GentaShoemaker, M.D.     DLS/MEDQ  D:  06/26/948502/24/2016  T:  12/24/2014  Job:  [id number]

## 2014-12-25 LAB — BASIC METABOLIC PANEL
ANION GAP: 7 (ref 5–15)
BUN: 8 mg/dL (ref 6–23)
CALCIUM: 8.2 mg/dL — AB (ref 8.4–10.5)
CO2: 24 mmol/L (ref 19–32)
CREATININE: 0.71 mg/dL (ref 0.50–1.10)
Chloride: 105 mmol/L (ref 96–112)
Glucose, Bld: 85 mg/dL (ref 70–99)
Potassium: 3.9 mmol/L (ref 3.5–5.1)
SODIUM: 136 mmol/L (ref 135–145)

## 2014-12-25 LAB — CBC
HCT: 33.3 % — ABNORMAL LOW (ref 36.0–46.0)
Hemoglobin: 10.8 g/dL — ABNORMAL LOW (ref 12.0–15.0)
MCH: 26.2 pg (ref 26.0–34.0)
MCHC: 32.4 g/dL (ref 30.0–36.0)
MCV: 80.6 fL (ref 78.0–100.0)
Platelets: 273 10*3/uL (ref 150–400)
RBC: 4.13 MIL/uL (ref 3.87–5.11)
RDW: 14.1 % (ref 11.5–15.5)
WBC: 8.8 10*3/uL (ref 4.0–10.5)

## 2014-12-25 MED ORDER — HYDROCODONE-ACETAMINOPHEN 5-325 MG PO TABS: 1.0000 | ORAL_TABLET | ORAL | Status: DC | PRN

## 2014-12-25 MED ORDER — AMOXICILLIN-POT CLAVULANATE 875-125 MG PO TABS: 1.0000 | ORAL_TABLET | Freq: Two times a day (BID) | ORAL | Status: DC

## 2014-12-25 NOTE — Discharge Summary (Addendum)
Alexandra Ramsey, is a 24 y.o. female  DOB 08/02/1991  MRN [id number].  Admission date:  12/22/2014  Admitting Physician  Clydia LlanoMutaz Elmahi, MD  Discharge Date:  12/25/2014   Primary MD  Dorrene GermanAVBUERE,EDWIN A, MD  Recommendations for primary care physician for things to follow:  Please Follow ENT recommendations. Please follow wound culture   Admission Diagnosis   Cellulitis and abscess of neck [L03.221, L02.11]   Discharge Diagnosis  Cellulitis and abscess of neck [L03.221, L02.11]   Active Problems:   Obesity (BMI 30-39.9)      Hospital Course  Alexandra Ramsey is a pleasant 24 y.o. female with past medical history of chronic colitis, (diagnosed at some point as ulcerative colitis and she was on Imuran and prednisone) who came in to the hospital complaining of neck swelling not responding to Augmentin being given in the outpatient setting. CT of the neck showed "17 x 16 mm fluid collection consistent with abscess is seen either within the posterior portion of the right parotid gland, or immediately posterior to it. There are surrounding inflammatory changes in the subcutaneous tissues consistent with associated cellulitis, as well as a mild amount of associated fluid between the right sternocleidomastoid muscle and right submandibular gland. Also noted are multiple enlarged lymph nodes deep to the right sternocleidomastoid muscle most likely inflammatory in origin". She therefore had incision and drainage on 12/23/14. Cultures showed no growth. Her white count which was 11,400 improved to 8800 after doses of vancomycin/Zosyn. She is better and will be discharged on Augmentin to follow with ENT in the next 2-3 weeks.   Discharge Condition Stable.  Consults obtained  None  Follow UP  ENT    Discharge Instructions  and   Discharge Medications  Discharge Instructions    Diet - low sodium heart healthy    Complete by:  As directed      Increase activity slowly    Complete by:  As directed             Medication List    STOP taking these medications        cephALEXin 500 MG capsule  Commonly known as:  KEFLEX      TAKE these medications        amoxicillin-clavulanate 875-125 MG per tablet  Commonly known as:  AUGMENTIN  Take 1 tablet by mouth 2 (two) times daily.     BC HEADACHE PO  Take 1 each by mouth as needed (headache).     HYDROcodone-acetaminophen 5-325 MG per tablet  Commonly known as:  NORCO/VICODIN  Take 1-2 tablets by mouth every 4 (four) hours as needed for moderate pain.     hydrocortisone cream 1 %  Apply to affected area 2 times daily     mupirocin ointment 2 %  Commonly known as:  BACTROBAN  Apply to skin tid after warm soak     naproxen sodium 220 MG tablet  Commonly known as:  ANAPROX  Take 440 mg by mouth daily as needed (pain).  predniSONE 10 MG tablet  Commonly known as:  DELTASONE  - Take 2 tablets (20 mg total) by mouth daily. Prednisone dose pack directions:   -   - 6 tabs on day one, 5 tabs on day two, 4 tabs on day three, 3 tabs on day four, 2 tabs on day five, 1 tab on day six. Disp # 21  -   - Tiffany Neva Seat, PA-C     pseudoephedrine 60 MG tablet  Commonly known as:  SUDAFED  Take 1 tablet (60 mg total) by mouth every 6 (six) hours as needed for congestion.        Diet and Activity recommendation: See Discharge Instructions above  Major procedures and Radiology Reports - PLEASE review detailed and final reports for all details, in brief -    Ct Soft Tissue Neck W Contrast  12/22/2014   CLINICAL DATA:  Acute neck pain and swelling for 2 days.  EXAM: CT NECK WITH CONTRAST  TECHNIQUE: Multidetector CT imaging of the neck was performed using the standard protocol following the bolus administration of intravenous contrast.  CONTRAST:  75mL  OMNIPAQUE IOHEXOL 300 MG/ML  SOLN  COMPARISON:  None.  FINDINGS: Pharynx and larynx: Normal.  Salivary glands: Left parotid gland and submandibular glands appear normal. 17 x 16 mm fluid collection is seen either within the posterior portion of the right parotid gland or immediately posterior to it anterior to the sternocleidomastoid muscle.  Thyroid: Normal.  Lymph nodes: Multiple enlarged lymph nodes are noted on the right side at the level 2 and level 3 regions. The largest measures 14 x 11 mm. There is some associated free fluid in the soft tissues anterior to the right sternocleidomastoid muscle and posterior to the right submandibular gland.  Vascular: Visualized vasculature appears normal.  Limited intracranial: No definite abnormality is seen.  Visualized orbits: No definite abnormality is seen.  Mastoids and visualized paranasal sinuses: No abnormality seen.  Skeleton: No abnormality seen.  Upper chest: No definite abnormality seen.  IMPRESSION: 17 x 16 mm fluid collection consistent with abscess is seen either within the posterior portion of the right parotid gland, or immediately posterior to it. There are surrounding inflammatory changes in the subcutaneous tissues consistent with associated cellulitis, as well as a mild amount of associated fluid between the right sternocleidomastoid muscle and right submandibular gland. Also noted are multiple enlarged lymph nodes deep to the right sternocleidomastoid muscle most likely inflammatory in origin.   Electronically Signed   By: Lupita Raider, M.D.   On: 12/22/2014 14:42    Micro Results   Recent Results (from the past 240 hour(s))  Surgical pcr screen     Status: Abnormal   Collection Time: 12/23/14  9:46 AM  Result Value Ref Range Status   MRSA, PCR NEGATIVE NEGATIVE Final   Staphylococcus aureus POSITIVE (A) NEGATIVE Final    Comment:        The Xpert SA Assay (FDA approved for NASAL specimens in patients over 38 years of age), is one  component of a comprehensive surveillance program.  Test performance has been validated by [facility] for patients greater than or equal to 64 year old. It is not intended to diagnose infection nor to guide or monitor treatment.   Anaerobic culture     Status: None (Preliminary result)   Collection Time: 12/23/14 11:09 AM  Result Value Ref Range Status   Specimen Description ABSCESS NECK RIGHT  Final   Special Requests NONE  Final   Gram Stain   Final    MODERATE WBC PRESENT, PREDOMINANTLY PMN NO SQUAMOUS EPITHELIAL CELLS SEEN NO ORGANISMS SEEN Performed at [facility]    Culture   Final    NO ANAEROBES ISOLATED; CULTURE IN PROGRESS FOR 5 DAYS Performed at [facility]    Report Status PENDING  Incomplete  Culture, routine-abscess     Status: None (Preliminary result)   Collection Time: 12/23/14 11:09 AM  Result Value Ref Range Status   Specimen Description ABSCESS NECK RIGHT  Final   Special Requests NONE  Final   Gram Stain   Final    MODERATE WBC PRESENT, PREDOMINANTLY PMN NO SQUAMOUS EPITHELIAL CELLS SEEN NO ORGANISMS SEEN Performed at [facility]    Culture   Final    NO GROWTH 2 DAYS Performed at [facility]    Report Status PENDING  Incomplete       Today   Subjective:   Alexandra Ramsey today has no headache,no chest abdominal pain,no new weakness tingling or numbness, feels much better wants to go home today.   Objective:   Blood pressure 85/46, pulse 74, temperature 98.1 F (36.7 C), temperature source Oral, resp. rate 19, height  (1.651 m), weight 108.863 kg (240 lb), last menstrual period 12/04/2014, SpO2 98 %.   Intake/Output Summary (Last 24 hours) at 12/25/14 1425 Last data filed at 12/24/14 2221  Gross per 24 hour  Intake      3 ml  Output      0 ml  Net      3 ml    Exam Awake Alert, Oriented x 3, No new F.N deficits, Normal affect [place].AT,PERRAL No drainage from right neck, dressing  clean. Symmetrical Chest wall movement, Good air movement bilaterally, CTAB RRR,No Gallops,Rubs or new Murmurs, No Parasternal Heave +ve B.Sounds, Abd Soft, Non tender, No organomegaly appriciated, No rebound -guarding or rigidity. No Cyanosis, Clubbing or edema, No new Rash or bruise  Data Review   CBC w Diff: Lab Results  Component Value Date   WBC 8.8 12/25/2014   HGB 10.8* 12/25/2014   HCT 33.3* 12/25/2014   PLT 273 12/25/2014   LYMPHOPCT 11* 12/22/2014   MONOPCT 8 12/22/2014   EOSPCT 2 12/22/2014   BASOPCT 0 12/22/2014    CMP: Lab Results  Component Value Date   NA 136 12/25/2014   K 3.9 12/25/2014   CL 105 12/25/2014   CO2 24 12/25/2014   BUN 8 12/25/2014   CREATININE 0.71 12/25/2014   PROT 6.2 12/23/2014   ALBUMIN 3.1* 12/23/2014   BILITOT 0.5 12/23/2014   ALKPHOS 59 12/23/2014   AST 13 12/23/2014   ALT 10 12/23/2014  .   Total Time in preparing paper work, data evaluation and todays exam - 25 minutes  Ranesha Val M.D on 12/25/2014 at 2:25 PM  [facility] Office  [phone]

## 2014-12-25 NOTE — Progress Notes (Signed)
Alexandra Ramsey to be D/C'd Home per MD order.  Discussed with the patient and all questions fully answered.  VSS, Surgical site clean, dry, intact with drain removed and dressing applied by surgeon. IV catheter discontinued intact. Site without signs and symptoms of complications. Dressing and pressure applied.  An After Visit Summary was printed and given to the patient. Patient received prescriptions and work note.  D/c education completed with patient/family including follow up instructions, medication list, d/c activities limitations if indicated, with other d/c instructions as indicated by MD - patient able to verbalize understanding, all questions fully answered.   Patient instructed to return to ED, call 911, or call MD for any changes in condition.   Patient able to ambulate out and D/C home via private auto.  Burt EkCook, Sharnelle Cappelli D 12/25/2014 2:41 PM

## 2014-12-25 NOTE — Progress Notes (Signed)
   ENT Progress Note: POD #2 s/p Procedure(s): incision and drainage right neck abscess   Subjective: Min pain  Objective: Vital signs in last 24 hours: Temp:  [97.8 F (36.6 C)-98.2 F (36.8 C)] 98.2 F (36.8 C) (02/26 0549) Pulse Rate:  [60-72] 60 (02/26 0549) Resp:  [17-18] 18 (02/26 0549) BP: (89-97)/(48-51) 89/51 mmHg (02/26 0549) SpO2:  [97 %-99 %] 99 % (02/26 0549) Weight change:  Last BM Date: 12/22/14  Intake/Output from previous day: 02/25 0701 - 02/26 0700 In: 363 [P.O.:360; I.V.:3] Out: -  Intake/Output this shift:    Labs:  Recent Labs  12/24/14 0458 12/25/14 0451  WBC 11.4* 8.8  HGB 11.0* 10.8*  HCT 33.5* 33.3*  PLT 258 273    Recent Labs  12/24/14 0458 12/25/14 0451  NA 138 136  K 4.1 3.9  CL 104 105  CO2 23 24  GLUCOSE 108* 85  BUN 6 8  CALCIUM 8.9 8.2*    Studies/Results: No results found.   PHYSICAL EXAM: Min drainage, penrose drain removed Sig improvement in erythema and swelling   Assessment/Plan: Plan d/c today per Med Svc Cont Abx; rec Augmentin XL bid for 10 d and prn pain meds Wound care and dressing for 3d Return to normal activities next week F/U ~3 wks for op recheck or prn.    Alexandra Ramsey 12/25/2014, 9:28 AM

## 2014-12-26 LAB — CULTURE, ROUTINE-ABSCESS: CULTURE: NO GROWTH

## 2014-12-28 LAB — ANAEROBIC CULTURE

## 2015-03-04 ENCOUNTER — Encounter (HOSPITAL_COMMUNITY): Payer: Self-pay | Admitting: Emergency Medicine

## 2015-03-04 ENCOUNTER — Emergency Department (INDEPENDENT_AMBULATORY_CARE_PROVIDER_SITE_OTHER)
Admission: EM | Admit: 2015-03-04 | Discharge: 2015-03-04 | Disposition: A | Discharge status: Home / Self Care | Payer: Self-pay | Source: Home / Self Care | Attending: Family Medicine | Admitting: Family Medicine

## 2015-03-04 DIAGNOSIS — K088 Other specified disorders of teeth and supporting structures: Secondary | ICD-10-CM

## 2015-03-04 DIAGNOSIS — K0889 Other specified disorders of teeth and supporting structures: Secondary | ICD-10-CM

## 2015-03-04 DIAGNOSIS — L439 Lichen planus, unspecified: Secondary | ICD-10-CM

## 2015-03-04 MED ORDER — BETAMETHASONE DIPROPIONATE 0.05 % EX OINT: TOPICAL_OINTMENT | Freq: Two times a day (BID) | CUTANEOUS | Status: DC

## 2015-03-04 MED ORDER — AMOXICILLIN 500 MG PO CAPS
500.0000 mg | ORAL_CAPSULE | Freq: Three times a day (TID) | ORAL | Status: DC
Start: 2015-03-04 — End: 2015-11-24

## 2015-03-04 NOTE — Discharge Instructions (Signed)
Thank you for coming in today. Call or go to the emergency room if you get worse, have trouble breathing, have chest pains, or palpitations.    Dental Pain A tooth ache may be caused by cavities (tooth decay). Cavities expose the nerve of the tooth to air and hot or cold temperatures. It may come from an infection or abscess (also called a boil or furuncle) around your tooth. It is also often caused by dental caries (tooth decay). This causes the pain you are having. DIAGNOSIS  Your caregiver can diagnose this problem by exam. TREATMENT   If caused by an infection, it may be treated with medications which kill germs (antibiotics) and pain medications as prescribed by your caregiver. Take medications as directed.  Only take over-the-counter or prescription medicines for pain, discomfort, or fever as directed by your caregiver.  Whether the tooth ache today is caused by infection or dental disease, you should see your dentist as soon as possible for further care. SEEK MEDICAL CARE IF: The exam and treatment you received today has been provided on an emergency basis only. This is not a substitute for complete medical or dental care. If your problem worsens or new problems (symptoms) appear, and you are unable to meet with your dentist, call or return to this location. SEEK IMMEDIATE MEDICAL CARE IF:   You have a fever.  You develop redness and swelling of your face, jaw, or neck.  You are unable to open your mouth.  You have severe pain uncontrolled by pain medicine. MAKE SURE YOU:   Understand these instructions.  Will watch your condition.  Will get help right away if you are not doing well or get worse. Document Released: 10/16/2005 Document Revised: 01/08/2012 Document Reviewed: 06/03/2008 [facility] Patient Information 2015 [place], [place]. This information is not intended to replace advice given to you by your health care provider. Make sure you discuss any questions you have with  your health care provider.  Lichen Planus Lichen planus is a skin problem. It causes redness, itching, puffiness (swelling), and sores. Areas of the body that are often affected are the:  Vulva and vagina.  Gums and inside of the mouth.  Tube between your mouth and stomach (esophagus).  Scalp.  Skin of the arms (especially wrists), legs, chest, back, and belly (abdomen).  Fingernails or toenails. The cause is not known. Lichen planus is not passed from one person to another. It can last for a long time. HOME CARE  Only take medicines as told by your doctor.  Keep the vagina as clean and dry as you can. GET HELP RIGHT AWAY IF: Your pain, puffiness, or redness gets worse. MAKE SURE YOU:  Understand these instructions.  Will watch your condition.  Will get help right away if you are not doing well or get worse. Document Released: 09/28/2008 Document Revised: 01/08/2012 Document Reviewed: 02/17/2011 [facility] Patient Information 2015 [place], [place]. This information is not intended to replace advice given to you by your health care provider. Make sure you discuss any questions you have with your health care provider.  [facility]:  [facility] [phone] (ext [phone])  [phone] [address]  Please call Dr. Lawrence Marseillesivils office [phone] or cell [phone] [address], [place]  Cost for tooth removal $200 includes exam, Xray, and extraction and follow up visit.  Bring list of current medications with you.   [facility] - [address][phone]  [facility] [phone]  [address] [facility] [address]  [facility]  [address], [place], [place], [id number]  [phone], Ext. [phone]  2nd and 4th Thursday of the month at 6:30am (Simple extractions only - no wisdom teeth or surgery) First come/First serve -First 10 clients served  [facility]([place] residents only)  [address], [place],  [place], [id number]  [phone]  [facility]  [phone]  [facility]  [phone]  [facility]  [phone]  Please call [facility] at [phone] to get the details to get your tooth pulled.

## 2015-03-04 NOTE — ED Notes (Signed)
Multiple complaints.  Toothache C/o "spots" on both feet that are spreading.

## 2015-03-04 NOTE — ED Provider Notes (Signed)
Alexandra Ramsey is a 24 y.o. female who presents to Urgent Care today for dental pain and rash. 1) patient has a one-week history of upper left dental pain. Left filling fell out of her to use 2 months ago. Pain is severe. She's tried some over-the-counter medicines which helped. No fevers or chills nausea vomiting.  2) rash. Patient has a pruritic hyperpigmented rash on her feet bilaterally present for about a month and a half. She's tried some hydrocortisone cream which did not help. Additionally she tried 2 weeks of over-the-counter athlete's foot cream which also did not help. No new soaps or shampoos medications etc. No fevers or chills.   Past Medical History  Diagnosis Date  . Ulcerative colitis 05/29/08 & 04/29/10    Colonoscopy with biopsy Dx'd  . Cellulitis and abscess of neck 12/22/2014    right  . History of blood transfusion     "related to ulcerative colitis"   Past Surgical History  Procedure Laterality Date  . Colonoscopy w/ biopsies  05/29/08 and 05/08/10    Biopsy consistent with colitis  . Tonsillectomy and adenoidectomy  ~ 2001  . Mass biopsy Right 12/23/2014    Procedure: incision and drainage right neck abscess;  Surgeon: Osborn Cohoavid Shoemaker, MD;  [facility];  Service: ENT;  Laterality: Right;   History  Substance Use Topics  . Smoking status: Current Every Day Smoker -- 0.50 packs/day for 7 years    Types: Cigarettes  . Smokeless tobacco: Never Used  . Alcohol Use: 1.2 oz/week    2 Cans of beer per week   ROS as above Medications: No current facility-administered medications for this encounter.   Current Outpatient Prescriptions  Medication Sig Dispense Refill  . amoxicillin (AMOXIL) 500 MG capsule Take 1 capsule (500 mg total) by mouth 3 (three) times daily. 30 capsule 0  . amoxicillin-clavulanate (AUGMENTIN) 875-125 MG per tablet Take 1 tablet by mouth 2 (two) times daily. (Patient not taking: Reported on 03/04/2015) 10 tablet 0  . Aspirin-Salicylamide-Caffeine  (BC HEADACHE PO) Take 1 each by mouth as needed (headache).    . betamethasone dipropionate (DIPROLENE) 0.05 % ointment Apply topically 2 (two) times daily. 45 g 3  . HYDROcodone-acetaminophen (NORCO/VICODIN) 5-325 MG per tablet Take 1-2 tablets by mouth every 4 (four) hours as needed for moderate pain. 20 tablet 0  . naproxen sodium (ANAPROX) 220 MG tablet Take 440 mg by mouth daily as needed (pain).    . predniSONE (DELTASONE) 10 MG tablet Take 2 tablets (20 mg total) by mouth daily. Prednisone dose pack directions:   6 tabs on day one, 5 tabs on day two, 4 tabs on day three, 3 tabs on day four, 2 tabs on day five, 1 tab on day six. Disp # 21  Marlon Peliffany Greene, PA-C (Patient not taking: Reported on 12/22/2014) 21 tablet 0   Allergies  Allergen Reactions  . Ibuprofen Other (See Comments)    Can't take due to UC     Exam:  BP 122/83 mmHg  Pulse 71  Temp(Src) 98.3 F (36.8 C) (Oral)  Resp 12  SpO2 98% Gen: Well NAD HEENT: EOMI,  MMM tender upper left tooth with, erythema no abscess visible Lungs: Normal work of breathing. CTABL Heart: RRR no MRG Abd: NABS, Soft. Nondistended, Nontender Exts: Brisk capillary refill, warm and well perfused.  Skin: Flat hyperpigmented pruritic papular lesions on feet bilaterally.      No results found for this or any previous visit (from the past  24 hour(s)). No results found.  Assessment and Plan: 24 y.o. female with  1) dental pain: Treat with amoxicillin follow-up with dentistry. 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  Discussed warning signs or symptoms. Please see discharge instructions. Patient expresses understanding.     Rodolph BongEvan S Revella Shelton, MD 03/04/15 [phone]

## 2015-03-19 ENCOUNTER — Encounter (HOSPITAL_COMMUNITY): Payer: Self-pay | Admitting: *Deleted

## 2015-03-19 ENCOUNTER — Emergency Department (HOSPITAL_COMMUNITY)
Admission: EM | Admit: 2015-03-19 | Discharge: 2015-03-19 | Disposition: A | Discharge status: Home / Self Care | Payer: Medicaid Other | Attending: Emergency Medicine | Admitting: Emergency Medicine

## 2015-03-19 DIAGNOSIS — Z872 Personal history of diseases of the skin and subcutaneous tissue: Secondary | ICD-10-CM | POA: Insufficient documentation

## 2015-03-19 DIAGNOSIS — Z720 Tobacco use: Secondary | ICD-10-CM | POA: Insufficient documentation

## 2015-03-19 DIAGNOSIS — I10 Essential (primary) hypertension: Secondary | ICD-10-CM | POA: Insufficient documentation

## 2015-03-19 DIAGNOSIS — Z202 Contact with and (suspected) exposure to infections with a predominantly sexual mode of transmission: Secondary | ICD-10-CM | POA: Insufficient documentation

## 2015-03-19 DIAGNOSIS — Z7951 Long term (current) use of inhaled steroids: Secondary | ICD-10-CM | POA: Insufficient documentation

## 2015-03-19 DIAGNOSIS — Z792 Long term (current) use of antibiotics: Secondary | ICD-10-CM | POA: Insufficient documentation

## 2015-03-19 DIAGNOSIS — Z8719 Personal history of other diseases of the digestive system: Secondary | ICD-10-CM | POA: Insufficient documentation

## 2015-03-19 MED ORDER — ONDANSETRON 4 MG PO TBDP
4.0000 mg | ORAL_TABLET | Freq: Once | ORAL | Status: AC
Start: 2015-03-19 — End: 2015-03-19
  Administered 2015-03-19: 4 mg via ORAL
  Filled 2015-03-19: qty 1

## 2015-03-19 MED ORDER — METRONIDAZOLE 500 MG PO TABS
2000.0000 mg | ORAL_TABLET | Freq: Once | ORAL | Status: AC
  Administered 2015-03-19: 2000 mg via ORAL
  Filled 2015-03-19: qty 4

## 2015-03-19 NOTE — Discharge Instructions (Signed)
Trichomoniasis Trichomoniasis is an infection caused by an organism called Trichomonas. The infection can affect both women and men. In women, the outer female genitalia and the vagina are affected. In men, the penis is mainly affected, but the prostate and other reproductive organs can also be involved. Trichomoniasis is a sexually transmitted infection (STI) and is most often passed to another person through sexual contact.  RISK FACTORS  Having unprotected sexual intercourse.  Having sexual intercourse with an infected partner. SIGNS AND SYMPTOMS  Symptoms of trichomoniasis in women include:  Abnormal gray-green frothy vaginal discharge.  Itching and irritation of the vagina.  Itching and irritation of the area outside the vagina. Symptoms of trichomoniasis in men include:   Penile discharge with or without pain.  Pain during urination. This results from inflammation of the urethra. DIAGNOSIS  Trichomoniasis may be found during a Pap test or physical exam. Your health care provider may use one of the following methods to help diagnose this infection:  Examining vaginal discharge under a microscope. For men, urethral discharge would be examined.  Testing the pH of the vagina with a test tape.  Using a vaginal swab test that checks for the Trichomonas organism. A test is available that provides results within a few minutes.  Doing a culture test for the organism. This is not usually needed. TREATMENT   You may be given medicine to fight the infection. Women should inform their health care provider if they could be or are pregnant. Some medicines used to treat the infection should not be taken during pregnancy.  Your health care provider may recommend over-the-counter medicines or creams to decrease itching or irritation.  Your sexual partner will need to be treated if infected. HOME CARE INSTRUCTIONS   Take medicines only as directed by your health care provider.  Take  over-the-counter medicine for itching or irritation as directed by your health care provider.  Do not have sexual intercourse while you have the infection.  Women should not douche or wear tampons while they have the infection.  Discuss your infection with your partner. Your partner may have gotten the infection from you, or you may have gotten it from your partner.  Have your sex partner get examined and treated if necessary.  Practice safe, informed, and protected sex.  See your health care provider for other STI testing. SEEK MEDICAL CARE IF:   You still have symptoms after you finish your medicine.  You develop abdominal pain.  You have pain when you urinate.  You have bleeding after sexual intercourse.  You develop a rash.  Your medicine makes you sick or makes you throw up (vomit). MAKE SURE YOU:  Understand these instructions.  Will watch your condition.  Will get help right away if you are not doing well or get worse. Document Released: 04/11/2001 Document Revised: 03/02/2014 Document Reviewed: 07/28/2013 [facility] Patient Information 2015 [place], [place]. This information is not intended to replace advice given to you by your health care provider. Make sure you discuss any questions you have with your health care provider.   Sexually Transmitted Disease A sexually transmitted disease (STD) is a disease or infection that may be passed (transmitted) from person to person, usually during sexual activity. This may happen by way of saliva, semen, blood, vaginal mucus, or urine. Common STDs include:   Gonorrhea.   Chlamydia.   Syphilis.   HIV and AIDS.   Genital herpes.   Hepatitis B and C.   Trichomonas.   Human  papillomavirus (HPV).   Pubic lice.   Scabies.  Mites.  Bacterial vaginosis. WHAT ARE CAUSES OF STDs? An STD may be caused by bacteria, a virus, or parasites. STDs are often transmitted during sexual activity if one person is  infected. However, they may also be transmitted through nonsexual means. STDs may be transmitted after:   Sexual intercourse with an infected person.   Sharing sex toys with an infected person.   Sharing needles with an infected person or using unclean piercing or tattoo needles.  Having intimate contact with the genitals, mouth, or rectal areas of an infected person.   Exposure to infected fluids during birth. WHAT ARE THE SIGNS AND SYMPTOMS OF STDs? Different STDs have different symptoms. Some people may not have any symptoms. If symptoms are present, they may include:   Painful or bloody urination.   Pain in the pelvis, abdomen, vagina, anus, throat, or eyes.   A skin rash, itching, or irritation.  Growths, ulcerations, blisters, or sores in the genital and anal areas.  Abnormal vaginal discharge with or without bad odor.   Penile discharge in men.   Fever.   Pain or bleeding during sexual intercourse.   Swollen glands in the groin area.   Yellow skin and eyes (jaundice). This is seen with hepatitis.   Swollen testicles.  Infertility.  Sores and blisters in the mouth. HOW ARE STDs DIAGNOSED? To make a diagnosis, your health care provider may:   Take a medical history.   Perform a physical exam.   Take a sample of any discharge to examine.  Swab the throat, cervix, opening to the penis, rectum, or vagina for testing.  Test a sample of your first morning urine.   Perform blood tests.   Perform a Pap test, if this applies.   Perform a colposcopy.   Perform a laparoscopy.  HOW ARE STDs TREATED? Treatment depends on the STD. Some STDs may be treated but not cured.   Chlamydia, gonorrhea, trichomonas, and syphilis can be cured with antibiotic medicine.   Genital herpes, hepatitis, and HIV can be treated, but not cured, with prescribed medicines. The medicines lessen symptoms.   Genital warts from HPV can be treated with medicine or  by freezing, burning (electrocautery), or surgery. Warts may come back.   HPV cannot be cured with medicine or surgery. However, abnormal areas may be removed from the cervix, vagina, or vulva.   If your diagnosis is confirmed, your recent sexual partners need treatment. This is true even if they are symptom-free or have a negative culture or evaluation. They should not have sex until their health care providers say it is okay. HOW CAN I REDUCE MY RISK OF GETTING AN STD? Take these steps to reduce your risk of getting an STD:  Use latex condoms, dental dams, and water-soluble lubricants during sexual activity. Do not use petroleum jelly or oils.  Avoid having multiple sex partners.  Do not have sex with someone who has other sex partners.  Do not have sex with anyone you do not know or who is at high risk for an STD.  Avoid risky sex practices that can break your skin.  Do not have sex if you have open sores on your mouth or skin.  Avoid drinking too much alcohol or taking illegal drugs. Alcohol and drugs can affect your judgment and put you in a vulnerable position.  Avoid engaging in oral and anal sex acts.  Get vaccinated for HPV and hepatitis. If  you have not received these vaccines in the past, talk to your health care provider about whether one or both might be right for you.   If you are at risk of being infected with HIV, it is recommended that you take a prescription medicine daily to prevent HIV infection. This is called pre-exposure prophylaxis (PrEP). You are considered at risk if:  You are a man who has sex with other men (MSM).  You are a heterosexual man or woman and are sexually active with more than one partner.  You take drugs by injection.  You are sexually active with a partner who has HIV.  Talk with your health care provider about whether you are at high risk of being infected with HIV. If you choose to begin PrEP, you should first be tested for HIV. You  should then be tested every 3 months for as long as you are taking PrEP.  WHAT SHOULD I DO IF I THINK I HAVE AN STD?  See your health care provider.   Tell your sexual partner(s). They should be tested and treated for any STDs.  Do not have sex until your health care provider says it is okay. WHEN SHOULD I GET IMMEDIATE MEDICAL CARE? Contact your health care provider right away if:   You have severe abdominal pain.  You are a man and notice swelling or pain in your testicles.  You are a woman and notice swelling or pain in your vagina. Document Released: 01/06/2003 Document Revised: 10/21/2013 Document Reviewed: 05/06/2013 [facility] Patient Information 2015 [place], [place]. This information is not intended to replace advice given to you by your health care provider. Make sure you discuss any questions you have with your health care provider. Safe Sex Safe sex is about reducing the risk of giving or getting a sexually transmitted disease (STD). STDs are spread through sexual contact involving the genitals, mouth, or rectum. Some STDs can be cured and others cannot. Safe sex can also prevent unintended pregnancies.  WHAT ARE SOME SAFE SEX PRACTICES?  Limit your sexual activity to only one partner who is having sex with only you.  Talk to your partner about his or her past partners, past STDs, and drug use.  Use a condom every time you have sexual intercourse. This includes vaginal, oral, and anal sexual activity. Both females and males should wear condoms during oral sex. Only use latex or polyurethane condoms and water-based lubricants. Using petroleum-based lubricants or oils to lubricate a condom will weaken the condom and increase the chance that it will break. The condom should be in place from the beginning to the end of sexual activity. Wearing a condom reduces, but does not completely eliminate, your risk of getting or giving an STD. STDs can be spread by contact with infected body  fluids and skin.  Get vaccinated for hepatitis B and HPV.  Avoid alcohol and recreational drugs, which can affect your judgment. You may forget to use a condom or participate in high-risk sex.  For females, avoid douching after sexual intercourse. Douching can spread an infection farther into the reproductive tract.  Check your body for signs of sores, blisters, rashes, or unusual discharge. See your health care provider if you notice any of these signs.  Avoid sexual contact if you have symptoms of an infection or are being treated for an STD. If you or your partner has herpes, avoid sexual contact when blisters are present. Use condoms at all other times.  If you are  at risk of being infected with HIV, it is recommended that you take a prescription medicine daily to prevent HIV infection. This is called pre-exposure prophylaxis (PrEP). You are considered at risk if:  You are a man who has sex with other men (MSM).  You are a heterosexual man or woman who is sexually active with more than one partner.  You take drugs by injection.  You are sexually active with a partner who has HIV.  Talk with your health care provider about whether you are at high risk of being infected with HIV. If you choose to begin PrEP, you should first be tested for HIV. You should then be tested every 3 months for as long as you are taking PrEP.  See your health care provider for regular screenings, exams, and tests for other STDs. Before having sex with a new partner, each of you should be screened for STDs and should talk about the results with each other. WHAT ARE THE BENEFITS OF SAFE SEX?   There is less chance of getting or giving an STD.  You can prevent unwanted or unintended pregnancies.  By discussing safe sex concerns with your partner, you may increase feelings of intimacy, comfort, trust, and honesty between the two of you. Document Released: 11/23/2004 Document Revised: 03/02/2014 Document  Reviewed: 04/08/2012 [facility] Patient Information 2015 [place], [place]. This information is not intended to replace advice given to you by your health care provider. Make sure you discuss any questions you have with your health care provider.

## 2015-03-19 NOTE — ED Notes (Signed)
[facility], and necessity of yearly checkups stressed again during discharge.

## 2015-03-19 NOTE — ED Notes (Signed)
Abigail, PA had an extensive conversation with pt about risk factors and need for gynecological care.  Pt given full disclosure of treatment options, and diagnostic tests.  Pt choosing to waive pelvic at this time.

## 2015-03-19 NOTE — ED Provider Notes (Signed)
CSN: [id number]     Arrival date & time 03/19/15  1636 History   First MD Initiated Contact with Patient 03/19/15 1755    This chart was scribed for non-physician practitioner, Arthor CaptainAbigail Ralston Venus, PA-C, working with Doug SouSam Jacubowitz, MD by Marica OtterNusrat Rahman, ED Scribe. This patient was seen in room TR01C/TR01C and the patient's care was started at [facility]6:12 PM.  Chief Complaint  Patient presents with  . Vaginal Discharge   The history is provided by the patient. No language interpreter was used.   PCP: Dorrene GermanAVBUERE,EDWIN A, MD HPI Comments: Alexandra Ramsey is a 24 y.o. female, with PMH noted below, who presents to the Emergency Department due to exposure to a STD. Pt reports she is sexually active with women only; her partners are sexually active with both men and women. Pt reports recently one of her partners was Dx with trichomonas; pt notes that she had oral sex only with said partner. Pt requests a STD check-- stating her overriding concern is whether she contracted Trichomonas from her partner; pt denies any present Sx. Pt states she does not want to have a pelvic exam. Pt denies any other complaints at this time.   Past Medical History  Diagnosis Date  . Ulcerative colitis 05/29/08 & 04/29/10    Colonoscopy with biopsy Dx'd  . Cellulitis and abscess of neck 12/22/2014    right  . History of blood transfusion     "related to ulcerative colitis"   Past Surgical History  Procedure Laterality Date  . Colonoscopy w/ biopsies  05/29/08 and 05/08/10    Biopsy consistent with colitis  . Tonsillectomy and adenoidectomy  ~ 2001  . Mass biopsy Right 12/23/2014    Procedure: incision and drainage right neck abscess;  Surgeon: Osborn Cohoavid Shoemaker, MD;  [facility];  Service: ENT;  Laterality: Right;   Family History  Problem Relation Age of Onset  . Colon polyps Father   . Hypertension Father   . Colon polyps Maternal Grandmother   . Colon polyps Paternal Grandmother   . Inflammatory bowel disease Neg Hx   .  Hypertension Mother    History  Substance Use Topics  . Smoking status: Current Every Day Smoker -- 0.50 packs/day for 7 years    Types: Cigarettes  . Smokeless tobacco: Never Used  . Alcohol Use: 1.2 oz/week    2 Cans of beer per week   OB History    No data available     Review of Systems  A complete 10 system review of systems was obtained and all systems are negative except as noted in the HPI and PMH.    Allergies  Ibuprofen  Home Medications   Prior to Admission medications   Medication Sig Start Date End Date Taking? Authorizing Provider  amoxicillin (AMOXIL) 500 MG capsule Take 1 capsule (500 mg total) by mouth 3 (three) times daily. 03/04/15   Rodolph BongEvan S Corey, MD  amoxicillin-clavulanate (AUGMENTIN) 875-125 MG per tablet Take 1 tablet by mouth 2 (two) times daily. Patient not taking: Reported on 03/04/2015 12/25/14   Conley CanalSimbiso Ranga, MD  Aspirin-Salicylamide-Caffeine (BC HEADACHE PO) Take 1 each by mouth as needed (headache).    Historical Provider, MD  betamethasone dipropionate (DIPROLENE) 0.05 % ointment Apply topically 2 (two) times daily. 03/04/15   Rodolph BongEvan S Corey, MD  HYDROcodone-acetaminophen (NORCO/VICODIN) 5-325 MG per tablet Take 1-2 tablets by mouth every 4 (four) hours as needed for moderate pain. 12/25/14   Simbiso Ranga, MD  naproxen sodium (ANAPROX) 220  MG tablet Take 440 mg by mouth daily as needed (pain).    Historical Provider, MD  predniSONE (DELTASONE) 10 MG tablet Take 2 tablets (20 mg total) by mouth daily. Prednisone dose pack directions:   6 tabs on day one, 5 tabs on day two, 4 tabs on day three, 3 tabs on day four, 2 tabs on day five, 1 tab on day six. Disp # 21  Marlon Peliffany Greene, PA-C Patient not taking: Reported on 12/22/2014 11/28/14   Marlon Peliffany Greene, PA-C   Triage Vitals: BP 127/100 mmHg  Pulse 76  Temp(Src) 98.3 F (36.8 C) (Oral)  Resp 18  Ht 5\' 6"  (1.676 m)  SpO2 100%  LMP 02/20/2015 Physical Exam  Constitutional: She is oriented to person,  place, and time. She appears well-developed and well-nourished. No distress.  HENT:  Head: Normocephalic and atraumatic.  Eyes: Conjunctivae and EOM are normal.  Neck: Neck supple.  Cardiovascular: Normal rate.   Pulmonary/Chest: Effort normal. No respiratory distress.  Musculoskeletal: Normal range of motion.  Neurological: She is alert and oriented to person, place, and time.  Skin: Skin is warm and dry.  Psychiatric: She has a normal mood and affect. Her behavior is normal.  Nursing note and vitals reviewed.   ED Course  Procedures (including critical care time) DIAGNOSTIC STUDIES: Oxygen Saturation is 100% on RA, nl by my interpretation.    COORDINATION OF CARE: 6:19 PM-Discussed treatment plan which includes Tx for Trichomonas; HIV test with pt at bedside and pt agreed to plan. Pt also advised to establish care with a gynecologist, practice safe sex.    Labs Review Labs Reviewed  WET PREP, GENITAL  HIV ANTIBODY (ROUTINE TESTING)  RPR  GC/CHLAMYDIA PROBE AMP ([place])    Imaging Review No results found.   EKG Interpretation None      MDM   Final diagnoses:  Exposure to STD  Essential hypertension    Patient here for exposure to trichomoniasis. She is a female who has sex with only other women. Her risk factors are very well. She does not share any point however she does have oral sex with other females. Some of her partners have sex with men. She's concerned and discussed wanting further testing but declined having a pelvic examination today. I had a long discussion with the patient about safe sex practices and advised her to follow-up with a gynecologist as well as referred her to the health department. [facility] for hypertension.  I personally performed the services described in this documentation, which was scribed in my presence. The recorded information has been reviewed and is accurate.      Arthor Captainbigail Shafter Jupin, PA-C 03/19/15 1845  Doug SouSam Jacubowitz,  MD 03/20/15 [phone]

## 2015-03-19 NOTE — ED Notes (Signed)
Pt denies any GU symptoms.  Sts her girlfriend was diagnosed with Trichomonis last week, and she is concerned and would like to be checked.  Sts she would also like to be checked for HIV.

## 2015-03-19 NOTE — ED Notes (Signed)
Pt reports recent vaginal discharge and itching. No acute distress noted at triage.

## 2015-03-20 LAB — HIV ANTIBODY (ROUTINE TESTING W REFLEX): HIV Screen 4th Generation wRfx: NONREACTIVE

## 2015-03-20 LAB — RPR: RPR Ser Ql: NONREACTIVE

## 2015-04-13 ENCOUNTER — Encounter (HOSPITAL_COMMUNITY): Payer: Self-pay | Admitting: Emergency Medicine

## 2015-04-13 ENCOUNTER — Emergency Department (HOSPITAL_COMMUNITY)
Admission: EM | Admit: 2015-04-13 | Discharge: 2015-04-13 | Disposition: A | Discharge status: Home / Self Care | Payer: Medicaid Other | Attending: Emergency Medicine | Admitting: Emergency Medicine

## 2015-04-13 DIAGNOSIS — K088 Other specified disorders of teeth and supporting structures: Secondary | ICD-10-CM | POA: Insufficient documentation

## 2015-04-13 DIAGNOSIS — Z7952 Long term (current) use of systemic steroids: Secondary | ICD-10-CM | POA: Insufficient documentation

## 2015-04-13 DIAGNOSIS — K0381 Cracked tooth: Secondary | ICD-10-CM | POA: Insufficient documentation

## 2015-04-13 DIAGNOSIS — Z792 Long term (current) use of antibiotics: Secondary | ICD-10-CM | POA: Insufficient documentation

## 2015-04-13 DIAGNOSIS — Z872 Personal history of diseases of the skin and subcutaneous tissue: Secondary | ICD-10-CM | POA: Insufficient documentation

## 2015-04-13 DIAGNOSIS — Z720 Tobacco use: Secondary | ICD-10-CM | POA: Insufficient documentation

## 2015-04-13 DIAGNOSIS — K0889 Other specified disorders of teeth and supporting structures: Secondary | ICD-10-CM

## 2015-04-13 MED ORDER — PENICILLIN V POTASSIUM 250 MG PO TABS: 250.0000 mg | ORAL_TABLET | Freq: Four times a day (QID) | ORAL | Status: AC

## 2015-04-13 MED ORDER — TRAMADOL HCL 50 MG PO TABS: 50.0000 mg | ORAL_TABLET | Freq: Four times a day (QID) | ORAL | Status: DC | PRN

## 2015-04-13 NOTE — ED Notes (Signed)
Pt reports filling came out a few months ago. This AM began having pain to left upper molar this morning while eating.

## 2015-04-13 NOTE — Discharge Instructions (Signed)

## 2015-04-13 NOTE — ED Provider Notes (Signed)
CSN: [id number]     Arrival date & time 04/13/15  1049 History  This chart was scribed for non-physician practitioner, Teressa Lower, NP, working with Mancel Bale, MD, by Ronney Lion, ED Scribe. This patient was seen in room TR08C/TR08C and the patient's care was started at 11:01 AM.    No chief complaint on file.  The history is provided by the patient. No language interpreter was used.   HPI Comments: Alexandra Ramsey is a 24 y.o. female who presents to the Emergency Department complaining of constant, throbbing upper left dental pain that began last night. She states she had a cavity in the same tooth, but the filling had fallen out. Patient has a history of ulcerative colitis and has been told in the past that she cannot take ibuprofen. She denies any known fever.  Past Medical History  Diagnosis Date  . Ulcerative colitis 05/29/08 & 04/29/10    Colonoscopy with biopsy Dx'd  . Cellulitis and abscess of neck 12/22/2014    right  . History of blood transfusion     "related to ulcerative colitis"   Past Surgical History  Procedure Laterality Date  . Colonoscopy w/ biopsies  05/29/08 and 05/08/10    Biopsy consistent with colitis  . Tonsillectomy and adenoidectomy  ~ 2001  . Mass biopsy Right 12/23/2014    Procedure: incision and drainage right neck abscess;  Surgeon: Osborn Coho, MD;  Location: [facility] OR;  Service: ENT;  Laterality: Right;   Family History  Problem Relation Age of Onset  . Colon polyps Father   . Hypertension Father   . Colon polyps Maternal Grandmother   . Colon polyps Paternal Grandmother   . Inflammatory bowel disease Neg Hx   . Hypertension Mother    History  Substance Use Topics  . Smoking status: Current Every Day Smoker -- 0.50 packs/day for 7 years    Types: Cigarettes  . Smokeless tobacco: Never Used  . Alcohol Use: 1.2 oz/week    2 Cans of beer per week   OB History    No data available     Review of Systems  Constitutional: Negative for fever.   HENT: Positive for dental problem.   All other systems reviewed and are negative.  Allergies  Ibuprofen  Home Medications   Prior to Admission medications   Medication Sig Start Date End Date Taking? Authorizing Provider  amoxicillin (AMOXIL) 500 MG capsule Take 1 capsule (500 mg total) by mouth 3 (three) times daily. 03/04/15   Rodolph Bong, MD  amoxicillin-clavulanate (AUGMENTIN) 875-125 MG per tablet Take 1 tablet by mouth 2 (two) times daily. Patient not taking: Reported on 03/04/2015 12/25/14   Conley Canal, MD  Aspirin-Salicylamide-Caffeine (BC HEADACHE PO) Take 1 each by mouth as needed (headache).    Historical Provider, MD  betamethasone dipropionate (DIPROLENE) 0.05 % ointment Apply topically 2 (two) times daily. 03/04/15   Rodolph Bong, MD  HYDROcodone-acetaminophen (NORCO/VICODIN) 5-325 MG per tablet Take 1-2 tablets by mouth every 4 (four) hours as needed for moderate pain. 12/25/14   Simbiso Ranga, MD  naproxen sodium (ANAPROX) 220 MG tablet Take 440 mg by mouth daily as needed (pain).    Historical Provider, MD  predniSONE (DELTASONE) 10 MG tablet Take 2 tablets (20 mg total) by mouth daily. Prednisone dose pack directions:   6 tabs on day one, 5 tabs on day two, 4 tabs on day three, 3 tabs on day four, 2 tabs on day five, 1 tab on  day six. Disp # 21  Marlon Pel, PA-C Patient not taking: Reported on 12/22/2014 11/28/14   Marlon Pel, PA-C   BP 103/59 mmHg  Pulse 86  Temp(Src) 98.4 F (36.9 C)  Resp 14  SpO2 99%  LMP 02/20/2015 Physical Exam  Constitutional: She is oriented to person, place, and time. She appears well-developed and well-nourished. No distress.  HENT:  Head: Normocephalic and atraumatic.  Right Ear: External ear normal.  Left Ear: External ear normal.  Mouth/Throat:    No gum or facial swelling noted  Eyes: Conjunctivae and EOM are normal.  Neck: Neck supple. No tracheal deviation present.  Cardiovascular: Normal rate.   Pulmonary/Chest:  Effort normal. No respiratory distress.  Musculoskeletal: Normal range of motion.  Neurological: She is alert and oriented to person, place, and time.  Skin: Skin is warm and dry.  Psychiatric: She has a normal mood and affect. Her behavior is normal.  Nursing note and vitals reviewed.   ED Course  Procedures (including critical care time)  DIAGNOSTIC STUDIES: Oxygen Saturation is 99% on RA, normal by my interpretation.    COORDINATION OF CARE: 11:05 AM - Discussed treatment plan with pt at bedside which includes antibiotics, non-NSAID pain medication (Ultram), and dental referral; pt verbalized understanding and agreed to plan.  MDM   Final diagnoses:  Toothache   Pt given pcn and ultram and given dental referral. No sign of ludwigs angina noted  I personally performed the services described in this documentation, which was scribed in my presence. The recorded information has been reviewed and is accurate.     Teressa Lower, NP 04/13/15 1122  Mancel Bale, MD 04/14/15 [phone]

## 2015-05-08 ENCOUNTER — Emergency Department (HOSPITAL_COMMUNITY)
Admission: EM | Admit: 2015-05-08 | Discharge: 2015-05-08 | Disposition: A | Discharge status: Home / Self Care | Payer: Medicaid Other | Attending: Emergency Medicine | Admitting: Emergency Medicine

## 2015-05-08 ENCOUNTER — Encounter (HOSPITAL_COMMUNITY): Payer: Self-pay | Admitting: Emergency Medicine

## 2015-05-08 DIAGNOSIS — Z872 Personal history of diseases of the skin and subcutaneous tissue: Secondary | ICD-10-CM | POA: Insufficient documentation

## 2015-05-08 DIAGNOSIS — Z720 Tobacco use: Secondary | ICD-10-CM | POA: Insufficient documentation

## 2015-05-08 DIAGNOSIS — Z3202 Encounter for pregnancy test, result negative: Secondary | ICD-10-CM | POA: Insufficient documentation

## 2015-05-08 DIAGNOSIS — Z792 Long term (current) use of antibiotics: Secondary | ICD-10-CM | POA: Insufficient documentation

## 2015-05-08 DIAGNOSIS — K529 Noninfective gastroenteritis and colitis, unspecified: Secondary | ICD-10-CM | POA: Insufficient documentation

## 2015-05-08 DIAGNOSIS — Z7982 Long term (current) use of aspirin: Secondary | ICD-10-CM | POA: Insufficient documentation

## 2015-05-08 LAB — CBC WITH DIFFERENTIAL/PLATELET
Basophils Absolute: 0 10*3/uL (ref 0.0–0.1)
Basophils Relative: 0 % (ref 0–1)
Eosinophils Absolute: 0.1 10*3/uL (ref 0.0–0.7)
Eosinophils Relative: 1 % (ref 0–5)
HEMATOCRIT: 39.5 % (ref 36.0–46.0)
Hemoglobin: 13 g/dL (ref 12.0–15.0)
Lymphocytes Relative: 27 % (ref 12–46)
Lymphs Abs: 2.7 10*3/uL (ref 0.7–4.0)
MCH: 27.4 pg (ref 26.0–34.0)
MCHC: 32.9 g/dL (ref 30.0–36.0)
MCV: 83.2 fL (ref 78.0–100.0)
MONO ABS: 0.6 10*3/uL (ref 0.1–1.0)
Monocytes Relative: 6 % (ref 3–12)
NEUTROS ABS: 6.7 10*3/uL (ref 1.7–7.7)
Neutrophils Relative %: 66 % (ref 43–77)
PLATELETS: 273 10*3/uL (ref 150–400)
RBC: 4.75 MIL/uL (ref 3.87–5.11)
RDW: 15.6 % — ABNORMAL HIGH (ref 11.5–15.5)
WBC: 10.1 10*3/uL (ref 4.0–10.5)

## 2015-05-08 LAB — URINALYSIS, ROUTINE W REFLEX MICROSCOPIC
Bilirubin Urine: NEGATIVE
Glucose, UA: NEGATIVE mg/dL
HGB URINE DIPSTICK: NEGATIVE
Ketones, ur: NEGATIVE mg/dL
Leukocytes, UA: NEGATIVE
Nitrite: NEGATIVE
PH: 7 (ref 5.0–8.0)
Protein, ur: NEGATIVE mg/dL
SPECIFIC GRAVITY, URINE: 1.01 (ref 1.005–1.030)
UROBILINOGEN UA: 0.2 mg/dL (ref 0.0–1.0)

## 2015-05-08 LAB — COMPREHENSIVE METABOLIC PANEL
ALK PHOS: 69 U/L (ref 38–126)
ALT: 15 U/L (ref 14–54)
ANION GAP: 8 (ref 5–15)
AST: 19 U/L (ref 15–41)
Albumin: 4.5 g/dL (ref 3.5–5.0)
BUN: 6 mg/dL (ref 6–20)
CALCIUM: 9.4 mg/dL (ref 8.9–10.3)
CO2: 25 mmol/L (ref 22–32)
Chloride: 105 mmol/L (ref 101–111)
Creatinine, Ser: 0.83 mg/dL (ref 0.44–1.00)
GFR calc non Af Amer: >60 mL/min (ref >60)
Glucose, Bld: 87 mg/dL (ref 65–99)
POTASSIUM: 3.7 mmol/L (ref 3.5–5.1)
Sodium: 138 mmol/L (ref 135–145)
TOTAL PROTEIN: 7.9 g/dL (ref 6.5–8.1)
Total Bilirubin: 0.9 mg/dL (ref 0.3–1.2)

## 2015-05-08 LAB — POC URINE PREG, ED: Preg Test, Ur: NEGATIVE

## 2015-05-08 LAB — LIPASE, BLOOD: Lipase: 15 U/L — ABNORMAL LOW (ref 22–51)

## 2015-05-08 MED ORDER — OXYCODONE-ACETAMINOPHEN 5-325 MG PO TABS
1.0000 | ORAL_TABLET | Freq: Once | ORAL | Status: AC
  Administered 2015-05-08: 1 via ORAL
  Filled 2015-05-08: qty 1

## 2015-05-08 MED ORDER — ONDANSETRON 4 MG PO TBDP
4.0000 mg | ORAL_TABLET | Freq: Once | ORAL | Status: AC
  Administered 2015-05-08: 4 mg via ORAL
  Filled 2015-05-08: qty 1

## 2015-05-08 MED ORDER — DICYCLOMINE HCL 10 MG PO CAPS
10.0000 mg | ORAL_CAPSULE | Freq: Once | ORAL | Status: AC
  Administered 2015-05-08: 10 mg via ORAL
  Filled 2015-05-08: qty 1

## 2015-05-08 NOTE — ED Notes (Signed)
Pt verbalizes understanding of d/c instructions. A/O x4 on departure. Ambulatory with steady gait.

## 2015-05-08 NOTE — ED Notes (Signed)
Pt. Stated, I started having pain in my stomach last night and this morning I started having 2 times of diarrhea and one time of vomiting.

## 2015-05-08 NOTE — Discharge Instructions (Signed)
You have declined a pelvic exam and a CT scan of her abdomen and pelvis at today's visit. Without these tests some of the problems that could be causing her pain could not be ruled out. If her symptoms change, worsen or persist please return to the emergency department for further evaluation.   Viral Gastroenteritis Viral gastroenteritis is also known as stomach flu. This condition affects the stomach and intestinal tract. It can cause sudden diarrhea and vomiting. The illness typically lasts 3 to 8 days. Most people develop an immune response that eventually gets rid of the virus. While this natural response develops, the virus can make you quite ill. CAUSES  Many different viruses can cause gastroenteritis, such as rotavirus or noroviruses. You can catch one of these viruses by consuming contaminated food or water. You may also catch a virus by sharing utensils or other personal items with an infected person or by touching a contaminated surface. SYMPTOMS  The most common symptoms are diarrhea and vomiting. These problems can cause a severe loss of body fluids (dehydration) and a body salt (electrolyte) imbalance. Other symptoms may include:  Fever.  Headache.  Fatigue.  Abdominal pain. DIAGNOSIS  Your caregiver can usually diagnose viral gastroenteritis based on your symptoms and a physical exam. A stool sample may also be taken to test for the presence of viruses or other infections. TREATMENT  This illness typically goes away on its own. Treatments are aimed at rehydration. The most serious cases of viral gastroenteritis involve vomiting so severely that you are not able to keep fluids down. In these cases, fluids must be given through an intravenous line (IV). HOME CARE INSTRUCTIONS   Drink enough fluids to keep your urine clear or pale yellow. Drink small amounts of fluids frequently and increase the amounts as tolerated.  Ask your caregiver for specific rehydration  instructions.  Avoid:  Foods high in sugar.  Alcohol.  Carbonated drinks.  Tobacco.  Juice.  Caffeine drinks.  Extremely hot or cold fluids.  Fatty, greasy foods.  Too much intake of anything at one time.  Dairy products until 24 to 48 hours after diarrhea stops.  You may consume probiotics. Probiotics are active cultures of beneficial bacteria. They may lessen the amount and number of diarrheal stools in adults. Probiotics can be found in yogurt with active cultures and in supplements.  Wash your hands well to avoid spreading the virus.  Only take over-the-counter or prescription medicines for pain, discomfort, or fever as directed by your caregiver. Do not give aspirin to children. Antidiarrheal medicines are not recommended.  Ask your caregiver if you should continue to take your regular prescribed and over-the-counter medicines.  Keep all follow-up appointments as directed by your caregiver. SEEK IMMEDIATE MEDICAL CARE IF:   You are unable to keep fluids down.  You do not urinate at least once every 6 to 8 hours.  You develop shortness of breath.  You notice blood in your stool or vomit. This may look like coffee grounds.  You have abdominal pain that increases or is concentrated in one small area (localized).  You have persistent vomiting or diarrhea.  You have a fever.  The patient is a child younger than 3 months, and he or she has a fever.  The patient is a child older than 3 months, and he or she has a fever and persistent symptoms.  The patient is a child older than 3 months, and he or she has a fever and symptoms suddenly  get worse.  The patient is a baby, and he or she has no tears when crying. MAKE SURE YOU:   Understand these instructions.  Will watch your condition.  Will get help right away if you are not doing well or get worse. Document Released: 10/16/2005 Document Revised: 01/08/2012 Document Reviewed: 08/02/2011 [facility] Patient  Information 2015 [place]. This information is not intended to replace advice given to you by your health care provider. Make sure you discuss any questions you have with your health care provider.

## 2015-05-08 NOTE — ED Notes (Signed)
Pt providing urine specimen at current time.

## 2015-05-08 NOTE — ED Provider Notes (Signed)
CSN: [id number]     Arrival date & time 05/08/15  1322 History   First MD Initiated Contact with Patient 05/08/15 [phone]   [phone]  Chief Complaint  Patient presents with  . Abdominal Pain  . Diarrhea  . Emesis     (Consider location/radiation/quality/duration/timing/severity/associated sxs/prior Treatment) HPI   PCP: Dorrene GermanAVBUERE,EDWIN A, MD Blood pressure 111/74, pulse 82, temperature 97.6 F (36.4 C), temperature source Oral, resp. rate 16, last menstrual period 04/23/2015, SpO2 100 %.  Desiree Haneiara S Riggio is a 24 y.o.female with a significant PMH of ulcerative colitis presents to the ER with complaints of abdominal pain that is suprapubic, 2 episodes of diarrhea (non bloody) and vomiting x 1.    She has UC but says this is different because it waxes and wanes, she has had vomiting and it is in the suprapubic region. UC usually is diffuse, diarrhea only and constant pain. She denies vaginal discharge or irregular bleeding- she is not sexually active. She denies dysuria and hematuria.  The patient denies diaphoresis, fever, headache, weakness (general or focal), confusion, change of vision,  neck pain, dysphagia, aphagia, chest pain, shortness of breath,  back pain, lower extremity swelling, rash.  Past Medical History  Diagnosis Date  . Ulcerative colitis 05/29/08 & 04/29/10    Colonoscopy with biopsy Dx'd  . Cellulitis and abscess of neck 12/22/2014    right  . History of blood transfusion     "related to ulcerative colitis"   Past Surgical History  Procedure Laterality Date  . Colonoscopy w/ biopsies  05/29/08 and 05/08/10    Biopsy consistent with colitis  . Tonsillectomy and adenoidectomy  ~ 2001  . Mass biopsy Right 12/23/2014    Procedure: incision and drainage right neck abscess;  Surgeon: Osborn Cohoavid Shoemaker, MD;  [facility];  Service: ENT;  Laterality: Right;   Family History  Problem Relation Age of Onset  . Colon polyps Father   . Hypertension Father   . Colon polyps Maternal  Grandmother   . Colon polyps Paternal Grandmother   . Inflammatory bowel disease Neg Hx   . Hypertension Mother    History  Substance Use Topics  . Smoking status: Current Every Day Smoker -- 0.50 packs/day for 7 years    Types: Cigarettes  . Smokeless tobacco: Never Used  . Alcohol Use: 1.2 oz/week    2 Cans of beer per week   OB History    No data available     Review of Systems  10 Systems reviewed and are negative for acute change except as noted in the HPI.    Allergies  Ibuprofen  Home Medications   Prior to Admission medications   Medication Sig Start Date End Date Taking? Authorizing Provider  amoxicillin (AMOXIL) 500 MG capsule Take 1 capsule (500 mg total) by mouth 3 (three) times daily. 03/04/15   Rodolph BongEvan S Corey, MD  amoxicillin-clavulanate (AUGMENTIN) 875-125 MG per tablet Take 1 tablet by mouth 2 (two) times daily. Patient not taking: Reported on 03/04/2015 12/25/14   Conley CanalSimbiso Ranga, MD  Aspirin-Salicylamide-Caffeine (BC HEADACHE PO) Take 1 each by mouth as needed (headache).    Historical Provider, MD  betamethasone dipropionate (DIPROLENE) 0.05 % ointment Apply topically 2 (two) times daily. 03/04/15   Rodolph BongEvan S Corey, MD  HYDROcodone-acetaminophen (NORCO/VICODIN) 5-325 MG per tablet Take 1-2 tablets by mouth every 4 (four) hours as needed for moderate pain. 12/25/14   Simbiso Ranga, MD  naproxen sodium (ANAPROX) 220 MG tablet Take 440 mg by  mouth daily as needed (pain).    Historical Provider, MD  predniSONE (DELTASONE) 10 MG tablet Take 2 tablets (20 mg total) by mouth daily. Prednisone dose pack directions:   6 tabs on day one, 5 tabs on day two, 4 tabs on day three, 3 tabs on day four, 2 tabs on day five, 1 tab on day six. Disp # 21  Marlon Pel, PA-C Patient not taking: Reported on 12/22/2014 11/28/14   Marlon Pel, PA-C  traMADol (ULTRAM) 50 MG tablet Take 1 tablet (50 mg total) by mouth every 6 (six) hours as needed. 04/13/15   Teressa Lower, NP   BP 120/80  mmHg  Pulse 93  Temp(Src) 97.6 F (36.4 C) (Oral)  Resp 18  SpO2 100%  LMP 04/23/2015 Physical Exam  Constitutional: She appears well-developed and well-nourished. No distress.  HENT:  Head: Normocephalic and atraumatic.  Eyes: Pupils are equal, round, and reactive to light.  Neck: Normal range of motion. Neck supple.  Cardiovascular: Normal rate and regular rhythm.   Pulmonary/Chest: Effort normal and breath sounds normal. She has no decreased breath sounds. She has no wheezes.  Abdominal: Soft. Bowel sounds are normal. She exhibits no distension. There is tenderness (reports tenderness to the suprapubic region). There is no rigidity, no rebound, no guarding and no CVA tenderness.  Neurological: She is alert.  Skin: Skin is warm and dry.  Nursing note and vitals reviewed.   ED Course  Procedures (including critical care time) Labs Review Labs Reviewed  CBC WITH DIFFERENTIAL/PLATELET - Abnormal; Notable for the following:    RDW 15.6 (*)    All other components within normal limits  LIPASE, BLOOD - Abnormal; Notable for the following:    Lipase 15 (*)    All other components within normal limits  COMPREHENSIVE METABOLIC PANEL  URINALYSIS, ROUTINE W REFLEX MICROSCOPIC (NOT AT [facility])  POC URINE PREG, ED    Imaging Review No results found.   EKG Interpretation None      MDM   Final diagnoses:  Gastroenteritis    PT declines pelvic x 2. She says she is not sexually active and does not believe that she has infection. She understands that certain pathology may be missed by skipping this portion of the examination.   I recommend that patient get a CT scan for further evaluation after normal urinalysis and declining a pelvic exam she also declines a CT scan of her abdomen. She reports that her partner needs to get to work, but her pain is not that serious, but she feels like she has a gastrointestinal bug. I told her that without the exam or CT scan a cannot rule out  serious things like ovarian torsion, appendicitis, diverticulitis, ulcerative flare. She says that if her pain is not getting better or starts to get worse she will be sure to come back right away.  Patient has no right lower quadrant pain, periumbilical pain or left lower quadrant pain, she is tender to the suprapubic region without guarding or peritoneal signs. She does not have an elevated white count, her CMP, urinalysis, lipase, CBC are all within normal limits. She is afebrile with normal vital signs. Likely this is gastroenteritis and I would not force the patient to sign out AGAINST MEDICAL ADVICE. She has promised me that she'll return if her symptoms worsen and understands risks of an incomplete workup.  Medications  oxyCODONE-acetaminophen (PERCOCET/ROXICET) 5-325 MG per tablet 1 tablet (1 tablet Oral Given 05/08/15 1559)  ondansetron (ZOFRAN-ODT) disintegrating  tablet 4 mg (4 mg Oral Given 05/08/15 1559)  dicyclomine (BENTYL) capsule 10 mg (10 mg Oral Given 05/08/15 1559)    24 y.o.Tylisa S Hobin's evaluation in the Emergency Department is complete. It has been determined that no acute conditions requiring further emergency intervention are present at this time. The patient/guardian have been advised of the diagnosis and plan. We have discussed signs and symptoms that warrant return to the ED, such as changes or worsening in symptoms.  Vital signs are stable at discharge. Filed Vitals:   05/08/15 1530  BP: 120/80  Pulse: 93  Temp:   Resp: 18    Patient/guardian has voiced understanding and agreed to follow-up with the PCP or specialist.   Marlon Pel, PA-C 05/08/15 1614  Lorre Nick, MD 05/09/15 [phone]

## 2015-06-04 ENCOUNTER — Encounter (HOSPITAL_COMMUNITY): Payer: Self-pay | Admitting: Emergency Medicine

## 2015-06-04 ENCOUNTER — Emergency Department (HOSPITAL_COMMUNITY)
Admission: EM | Admit: 2015-06-04 | Discharge: 2015-06-04 | Disposition: A | Discharge status: Home / Self Care | Payer: Medicaid Other | Attending: Emergency Medicine | Admitting: Emergency Medicine

## 2015-06-04 DIAGNOSIS — K088 Other specified disorders of teeth and supporting structures: Secondary | ICD-10-CM | POA: Insufficient documentation

## 2015-06-04 DIAGNOSIS — Z79899 Other long term (current) drug therapy: Secondary | ICD-10-CM | POA: Insufficient documentation

## 2015-06-04 DIAGNOSIS — Z720 Tobacco use: Secondary | ICD-10-CM | POA: Insufficient documentation

## 2015-06-04 DIAGNOSIS — K0889 Other specified disorders of teeth and supporting structures: Secondary | ICD-10-CM

## 2015-06-04 DIAGNOSIS — Z792 Long term (current) use of antibiotics: Secondary | ICD-10-CM | POA: Insufficient documentation

## 2015-06-04 DIAGNOSIS — Z872 Personal history of diseases of the skin and subcutaneous tissue: Secondary | ICD-10-CM | POA: Insufficient documentation

## 2015-06-04 MED ORDER — PENICILLIN V POTASSIUM 250 MG PO TABS: 250.0000 mg | ORAL_TABLET | Freq: Four times a day (QID) | ORAL | Status: AC

## 2015-06-04 NOTE — ED Notes (Signed)
Pt states that she had a tooth filled 2 months ago, but the filling fell out and she has not had the filling replaced. Pt also reports "welts" on the inside of her mouth.

## 2015-06-04 NOTE — Discharge Instructions (Signed)

## 2015-06-04 NOTE — ED Notes (Signed)
Browning, PA at bedside 

## 2015-06-04 NOTE — ED Provider Notes (Signed)
CSN: [id number]     Arrival date & time 06/04/15  0549 History   First MD Initiated Contact with Patient 06/04/15 0559     Chief Complaint  Patient presents with  . Dental Pain     (Consider location/radiation/quality/duration/timing/severity/associated sxs/prior Treatment) HPI Comments: Patient presents to the ED with a chief complaint of dental pain.  Patient complains of having dental pain x 2 months.  She has tried taking acetaminophen with no relief.  She denies fevers, chills, nausea, and vomiting.  She does have a dentist.  There are no aggravating or alleviating factors.  The history is provided by the patient. No language interpreter was used.    Past Medical History  Diagnosis Date  . Ulcerative colitis 05/29/08 & 04/29/10    Colonoscopy with biopsy Dx'd  . Cellulitis and abscess of neck 12/22/2014    right  . History of blood transfusion     "related to ulcerative colitis"   Past Surgical History  Procedure Laterality Date  . Colonoscopy w/ biopsies  05/29/08 and 05/08/10    Biopsy consistent with colitis  . Tonsillectomy and adenoidectomy  ~ 2001  . Mass biopsy Right 12/23/2014    Procedure: incision and drainage right neck abscess;  Surgeon: Osborn Coho, MD;  Location: [facility] OR;  Service: ENT;  Laterality: Right;   Family History  Problem Relation Age of Onset  . Colon polyps Father   . Hypertension Father   . Colon polyps Maternal Grandmother   . Colon polyps Paternal Grandmother   . Inflammatory bowel disease Neg Hx   . Hypertension Mother    History  Substance Use Topics  . Smoking status: Current Every Day Smoker -- 0.50 packs/day for 7 years    Types: Cigarettes  . Smokeless tobacco: Never Used  . Alcohol Use: 1.2 oz/week    2 Cans of beer per week   OB History    No data available     Review of Systems  Constitutional: Negative for fever and chills.  HENT: Positive for dental problem. Negative for drooling.   Respiratory: Negative for shortness of  breath.   Cardiovascular: Negative for chest pain.  Gastrointestinal: Negative for nausea, vomiting, diarrhea and constipation.  Genitourinary: Negative for dysuria.  Neurological: Negative for speech difficulty.  Psychiatric/Behavioral: Positive for sleep disturbance.      Allergies  Ibuprofen  Home Medications   Prior to Admission medications   Medication Sig Start Date End Date Taking? Authorizing Provider  amoxicillin (AMOXIL) 500 MG capsule Take 1 capsule (500 mg total) by mouth 3 (three) times daily. 03/04/15   Rodolph Bong, MD  amoxicillin-clavulanate (AUGMENTIN) 875-125 MG per tablet Take 1 tablet by mouth 2 (two) times daily. Patient not taking: Reported on 03/04/2015 12/25/14   Conley Canal, MD  Aspirin-Salicylamide-Caffeine (BC HEADACHE PO) Take 1 each by mouth as needed (headache).    Historical Provider, MD  betamethasone dipropionate (DIPROLENE) 0.05 % ointment Apply topically 2 (two) times daily. 03/04/15   Rodolph Bong, MD  HYDROcodone-acetaminophen (NORCO/VICODIN) 5-325 MG per tablet Take 1-2 tablets by mouth every 4 (four) hours as needed for moderate pain. 12/25/14   Simbiso Ranga, MD  naproxen sodium (ANAPROX) 220 MG tablet Take 440 mg by mouth daily as needed (pain).    Historical Provider, MD  penicillin v potassium (VEETID) 250 MG tablet Take 1 tablet (250 mg total) by mouth 4 (four) times daily. 06/04/15 06/11/15  Roxy Horseman, PA-C  predniSONE (DELTASONE) 10 MG tablet Take 2  tablets (20 mg total) by mouth daily. Prednisone dose pack directions:   6 tabs on day one, 5 tabs on day two, 4 tabs on day three, 3 tabs on day four, 2 tabs on day five, 1 tab on day six. Disp # 21  Marlon Pel, PA-C Patient not taking: Reported on 12/22/2014 11/28/14   Marlon Pel, PA-C  traMADol (ULTRAM) 50 MG tablet Take 1 tablet (50 mg total) by mouth every 6 (six) hours as needed. 04/13/15   Teressa Lower, NP   BP 108/63 mmHg  Pulse 68  Temp(Src) 98.3 F (36.8 C) (Oral)  Resp 20   SpO2 100%  LMP 05/23/2015 Physical Exam  Constitutional: She is oriented to person, place, and time. She appears well-developed and well-nourished.  HENT:  Head: Normocephalic and atraumatic.  Mouth/Throat:    Poor dentition throughout.  Affected tooth as diagrammed.  No signs of peritonsillar or tonsillar abscess.  No signs of gingival abscess. Oropharynx is clear and without exudates.  Uvula is midline.  Airway is intact. No signs of Ludwig's angina with palpation of oral and sublingual mucosa.   Eyes: Conjunctivae and EOM are normal.  Neck: Normal range of motion.  Cardiovascular: Normal rate.   Pulmonary/Chest: Effort normal.  Abdominal: She exhibits no distension.  Musculoskeletal: Normal range of motion.  Neurological: She is alert and oriented to person, place, and time.  Skin: Skin is dry.  Psychiatric: She has a normal mood and affect. Her behavior is normal. Judgment and thought content normal.  Nursing note and vitals reviewed.   ED Course  Procedures (including critical care time) Labs Review Labs Reviewed - No data to display  Imaging Review No results found.   EKG Interpretation None      MDM   Final diagnoses:  Pain, dental    Patient with toothache.  No gross abscess.  Exam unconcerning for Ludwig's angina or spread of infection.  Will treat with penicillin and OTC pain medicine.  Urged patient to follow-up with dentist.       Roxy Horseman, PA-C 06/04/15 1610  Blake Divine, MD 06/04/15 [id number]

## 2015-06-12 ENCOUNTER — Encounter (HOSPITAL_COMMUNITY): Payer: Self-pay | Admitting: *Deleted

## 2015-06-12 ENCOUNTER — Emergency Department (HOSPITAL_COMMUNITY)
Admission: EM | Admit: 2015-06-12 | Discharge: 2015-06-12 | Disposition: A | Discharge status: Home / Self Care | Payer: Medicaid Other | Attending: Emergency Medicine | Admitting: Emergency Medicine

## 2015-06-12 DIAGNOSIS — Z720 Tobacco use: Secondary | ICD-10-CM | POA: Insufficient documentation

## 2015-06-12 DIAGNOSIS — K088 Other specified disorders of teeth and supporting structures: Secondary | ICD-10-CM | POA: Insufficient documentation

## 2015-06-12 DIAGNOSIS — Z792 Long term (current) use of antibiotics: Secondary | ICD-10-CM | POA: Insufficient documentation

## 2015-06-12 DIAGNOSIS — Z872 Personal history of diseases of the skin and subcutaneous tissue: Secondary | ICD-10-CM | POA: Insufficient documentation

## 2015-06-12 DIAGNOSIS — K0889 Other specified disorders of teeth and supporting structures: Secondary | ICD-10-CM

## 2015-06-12 DIAGNOSIS — Z79899 Other long term (current) drug therapy: Secondary | ICD-10-CM | POA: Insufficient documentation

## 2015-06-12 MED ORDER — TRAMADOL HCL 50 MG PO TABS: 50.0000 mg | ORAL_TABLET | Freq: Once | ORAL | Status: DC

## 2015-06-12 MED ORDER — TRAMADOL HCL 50 MG PO TABS
50.0000 mg | ORAL_TABLET | Freq: Once | ORAL | Status: AC
  Administered 2015-06-12: 50 mg via ORAL
  Filled 2015-06-12: qty 1

## 2015-06-12 NOTE — ED Notes (Signed)
Bed: WTR5 Expected date:  Expected time:  Means of arrival:  Comments: 

## 2015-06-12 NOTE — ED Notes (Signed)
Called for triage x 1  No response 

## 2015-06-12 NOTE — ED Notes (Signed)
Pt complains of pain in her upper molar for the past week. Pt states she has a cavity in her tooth. Pain 10/10.

## 2015-06-12 NOTE — ED Provider Notes (Signed)
CSN: [id number]     Arrival date & time 06/12/15  2209 History  This chart was scribed for non-physician practitioner Earley Favor, NP, working with Mancel Bale, MD, by Tanda Rockers, ED Scribe. This patient was seen in room WTR1/WLPT1 and the patient's care was started at 10:49 PM.  Chief Complaint  Patient presents with  . Dental Pain   The history is provided by the patient. No language interpreter was used.     HPI Comments: Alexandra Ramsey is a 24 y.o. female who presents to the Emergency Department complaining of sudden onset, constant, left upper dental pain x 1 day. Pt does not have a dentist currently. Denies fever, chills, or any other associated symptoms. Pt was seen in ED on 06/04/2015 (1 week ago) for similar symptoms. She was given prescription for penicillin at that time and told to follow up with a dentist.    Past Medical History  Diagnosis Date  . Ulcerative colitis 05/29/08 & 04/29/10    Colonoscopy with biopsy Dx'd  . Cellulitis and abscess of neck 12/22/2014    right  . History of blood transfusion     "related to ulcerative colitis"   Past Surgical History  Procedure Laterality Date  . Colonoscopy w/ biopsies  05/29/08 and 05/08/10    Biopsy consistent with colitis  . Tonsillectomy and adenoidectomy  ~ 2001  . Mass biopsy Right 12/23/2014    Procedure: incision and drainage right neck abscess;  Surgeon: Osborn Coho, MD;  Location: [facility] OR;  Service: ENT;  Laterality: Right;   Family History  Problem Relation Age of Onset  . Colon polyps Father   . Hypertension Father   . Colon polyps Maternal Grandmother   . Colon polyps Paternal Grandmother   . Inflammatory bowel disease Neg Hx   . Hypertension Mother    Social History  Substance Use Topics  . Smoking status: Current Every Day Smoker -- 0.50 packs/day for 7 years    Types: Cigarettes  . Smokeless tobacco: Never Used  . Alcohol Use: 1.2 oz/week    2 Cans of beer per week   OB History    No data  available     Review of Systems  Constitutional: Negative for fever and chills.  HENT: Positive for dental problem. Negative for rhinorrhea.   All other systems reviewed and are negative.  Allergies  Ibuprofen  Home Medications   Prior to Admission medications   Medication Sig Start Date End Date Taking? Authorizing Provider  amoxicillin (AMOXIL) 500 MG capsule Take 1 capsule (500 mg total) by mouth 3 (three) times daily. 03/04/15   Rodolph Bong, MD  amoxicillin-clavulanate (AUGMENTIN) 875-125 MG per tablet Take 1 tablet by mouth 2 (two) times daily. Patient not taking: Reported on 03/04/2015 12/25/14   Conley Canal, MD  Aspirin-Salicylamide-Caffeine (BC HEADACHE PO) Take 1 each by mouth as needed (headache).    Historical Provider, MD  betamethasone dipropionate (DIPROLENE) 0.05 % ointment Apply topically 2 (two) times daily. 03/04/15   Rodolph Bong, MD  HYDROcodone-acetaminophen (NORCO/VICODIN) 5-325 MG per tablet Take 1-2 tablets by mouth every 4 (four) hours as needed for moderate pain. 12/25/14   Simbiso Ranga, MD  naproxen sodium (ANAPROX) 220 MG tablet Take 440 mg by mouth daily as needed (pain).    Historical Provider, MD  predniSONE (DELTASONE) 10 MG tablet Take 2 tablets (20 mg total) by mouth daily. Prednisone dose pack directions:   6 tabs on day one, 5 tabs on day  two, 4 tabs on day three, 3 tabs on day four, 2 tabs on day five, 1 tab on day six. Disp # 21  Marlon Pel, PA-C Patient not taking: Reported on 12/22/2014 11/28/14   Marlon Pel, PA-C  traMADol (ULTRAM) 50 MG tablet Take 1 tablet (50 mg total) by mouth every 6 (six) hours as needed. 04/13/15   Teressa Lower, NP   Triage Vitals: BP 120/78 mmHg  Pulse 85  Temp(Src) 98.9 F (37.2 C) (Oral)  Resp 18  SpO2 100%  LMP 05/23/2015   Physical Exam  Constitutional: She appears well-developed and well-nourished.  HENT:  Head: Normocephalic.  No cavity, no gum swelling noted  Neck: Normal range of motion.   Pulmonary/Chest: Effort normal.  Musculoskeletal: Normal range of motion.  Neurological: She is alert.  Skin: Skin is warm.  Nursing note and vitals reviewed.   ED Course  Procedures (including critical care time)  DIAGNOSTIC STUDIES: Oxygen Saturation is 100% on RA, normal by my interpretation.    COORDINATION OF CARE: 10:51 PM-Discussed treatment plan which includes following up with dentist with pt at bedside and pt agreed to plan.   Labs Review Labs Reviewed - No data to display  Imaging Review No results found.    EKG Interpretation None     I do not see any cavity, swelling to that is not sensitive to touch.  She was given Ultram for pain control and resources to help her find a physician/dentist.  She has been to the emergency department multiple times with the complaint of dental pain MDM   Final diagnoses:  None    I personally performed the services described in this documentation, which was scribed in my presence. The recorded information has been reviewed and is accurate.     Earley Favor, NP 06/12/15 2257  Mancel Bale, MD 06/13/15 0001

## 2015-06-12 NOTE — Discharge Instructions (Signed)
Dental Pain °A tooth ache may be caused by cavities (tooth decay). Cavities expose the nerve of the tooth to air and hot or cold temperatures. It may come from an infection or abscess (also called a boil or furuncle) around your tooth. It is also often caused by dental caries (tooth decay). This causes the pain you are having. °DIAGNOSIS  °Your caregiver can diagnose this problem by exam. °TREATMENT  °· If caused by an infection, it may be treated with medications which kill germs (antibiotics) and pain medications as prescribed by your caregiver. Take medications as directed. °· Only take over-the-counter or prescription medicines for pain, discomfort, or fever as directed by your caregiver. °· Whether the tooth ache today is caused by infection or dental disease, you should see your dentist as soon as possible for further care. °SEEK MEDICAL CARE IF: °The exam and treatment you received today has been provided on an emergency basis only. This is not a substitute for complete medical or dental care. If your problem worsens or new problems (symptoms) appear, and you are unable to meet with your dentist, call or return to this location. °SEEK IMMEDIATE MEDICAL CARE IF:  °· You have a fever. °· You develop redness and swelling of your face, jaw, or neck. °· You are unable to open your mouth. °· You have severe pain uncontrolled by pain medicine. °MAKE SURE YOU:  °· Understand these instructions. °· Will watch your condition. °· Will get help right away if you are not doing well or get worse. °Document Released: 10/16/2005 Document Revised: 01/08/2012 Document Reviewed: 06/03/2008 °ExitCare® Patient Information ©2015 [employer]. This information is not intended to replace advice given to you by your health care provider. Make sure you discuss any questions you have with your health care provider. ° °Emergency Department Resource Guide °1) Find a Doctor and Pay Out of Pocket °Although you won't have to find out who  is covered by your insurance plan, it is a good idea to ask around and get recommendations. You will then need to call the office and see if the doctor you have chosen will accept you as a new patient and what types of options they offer for patients who are self-pay. Some doctors offer discounts or will set up payment plans for their patients who do not have insurance, but you will need to ask so you aren't surprised when you get to your appointment. ° °2) Contact Your Local Health Department °Not all health departments have doctors that can see patients for sick visits, but many do, so it is worth a call to see if yours does. If you don't know where your local health department is, you can check in your phone book. The CDC also has a tool to help you locate your state's health department, and many state websites also have listings of all of their local health departments. ° °3) Find a Walk-in Clinic °If your illness is not likely to be very severe or complicated, you may want to try a walk in clinic. These are popping up all over the country in pharmacies, drugstores, and shopping centers. They're usually staffed by nurse practitioners or physician assistants that have been trained to treat common illnesses and complaints. They're usually fairly quick and inexpensive. However, if you have serious medical issues or chronic medical problems, these are probably not your best option. ° °No Primary Care Doctor: °- Call [facility] at  832-8000 - they can help you locate a primary   care doctor that  accepts your insurance, provides certain services, etc. °- Physician Referral Service- 1-800-533-3463 ° °Chronic Pain Problems: °Organization         Address  Phone   Notes  °[facility]  (336) 297-2271 Patients need to be referred by their primary care doctor.  ° °Medication Assistance: °Organization         Address  Phone   Notes  °[place] [facility] [address]  [address] °[place], [place] [address] (336) 641-8030 --Must be a resident of [place] °-- Must have NO insurance coverage whatsoever (no Medicaid/ Medicare, etc.) °-- The pt. MUST have a primary care doctor that directs their care regularly and follows them in the community °  °[facility]  (866) 331-1348   °[employer]  (888) 892-1162   ° °Agencies that provide inexpensive medical care: °Organization         Address  Phone   Notes  °[facility]  (336) 832-8035   °[facility]    (336) 832-7272   °[facility] [address] °[place], [place] [address] (336) 832-4777   °[facility] [address], °[place] (336) 271-4999   °[facility]    (336) 373-0678   °[facility]    (336) 272-1050   °[facility] ° [address], [place] Phone:  (336) 832-4444, Fax:  (336) 832-4440 Hours of Operation:  9 am - 6 pm, M-F.  Also accepts Medicaid/Medicare and self-pay.  °[facility] ° [address], [place] Phone: (336) 832-3150, Fax: (336) [phone]. Hours of Operation:  8:30 am - 5:30 pm, M-F.  Also accepts Medicaid and self-pay.  °[facility] [place] [address], [place] Phone: (336) 878-6027   °[facility] [address], [place], [place] (336)723-1848, Ext. 123 Mondays & Thursdays: 7-9 AM.  First 15 patients are seen on a first come, first serve basis. °  ° °Medicaid-accepting [place] Providers: ° °Organization         Address  Phone   Notes  °[facility] [address], [place] (336) 641-2100 Also accepts self-pay patients.  °[facility] [address], [place] ° (336) 856-9996   °[facility] [address], [place] (336) 288-8857   °[facility] [address] [place] [address], [place] (336) 299-7000   °Veita Bland [address], [place]  ° (336) 373-1557 Only accepts  Access Medicaid patients after they have their name applied to their card.  ° °Self-Pay (no insurance) in [place]: ° °Organization         Address  Phone   Notes  °Sickle Cell Patients, [facility] [address], [place] (336) 832-1970   °[facility] [address], [place] (336) 832-4400   °[facility] ° [address], [place] (336) 992-4800   °[facility]/Dr. Osei-Bonsu ° [address] [place] [address], [place] or [address], [place] (336) 841-8500 Phone number for both [place] and [place] locations is the same.  °[facility] [address], [place] (336) 299-0000   °[facility] [address] [place] [address], [place] or [address] (336) 852-7530 °(336) 878-2260   °  [facility] [address], [place] (336) 350-1642, phone; (336) 294-5005, fax Sees patients 1st and 3rd Saturday of every month.  Must not qualify for public or private insurance (i.e. Medicaid, Medicare, [place] Health Choice, Veterans' Benefits) • Household income should be no more than 200% of the poverty level •The clinic cannot treat you if you are pregnant or think you are pregnant • Sexually transmitted diseases are not treated at the clinic.  ° ° °Dental Care: °Organization         Address  Phone  Notes  °[facility] [address], [place] (336) 641-6152 Accepts children up to age 21 who are enrolled in Medicaid or [place] Health Choice; pregnant women with a Medicaid card; and children who have applied for Medicaid or [place] Health Choice, but were declined, whose parents can pay a reduced fee at time of service.  °[facility] [place]  [address], [place] (336) 641-7733 Accepts children up to age 21 who are  enrolled in Medicaid or [place] Health Choice; pregnant women with a Medicaid card; and children who have applied for Medicaid or [place] Health Choice, but were declined, whose parents can pay a reduced fee at time of service.  °[facility] ° [address], [place] (336) 641-4533 Patients are seen by appointment only. Walk-ins are not accepted. [facility] will see patients 18 years of age and older. °Monday - Tuesday (8am-5pm) °Most Wednesdays (8:30-5pm) °$30 per visit, cash only  °[facility] ° [address], [place] (336) 641-4533 Patients are seen by appointment only. Walk-ins are not accepted. [facility] will see patients 18 years of age and older. °One Wednesday Evening (Monthly: Volunteer Based).  $30 per visit, cash only  °[facility]  (919) 537-3737 for adults; Children under age 4, call Graduate Pediatric Dentistry at ([phone]) [phone]. Children aged 4-14, please call ([phone]) [phone] to request a pediatric application. ° Dental services are provided in all areas of dental care including fillings, crowns and bridges, complete and partial dentures, implants, gum treatment, root canals, and extractions. Preventive care is also provided. Treatment is provided to both adults and children. °Patients are selected via a lottery and there is often a waiting list. °  °[facility] [address], °[place] ° (336) 763-8833 [email or web address] °  °[facility] [address], [place], [place] (336)723-1848, Ext. 123 Second and Fourth Thursday of each month, opens at 6:30 AM; Clinic ends at 9 AM.  Patients are seen on a first-come first-served basis, and a limited number are seen during each clinic.  ° °[facility] ° [address], [place], [place] (336) 723-7904   Eligibility Requirements °You must have lived in [place], [place], or [place] counties for at least the last three months. °  You  cannot be eligible for state or federal sponsored healthcare insurance, including Veterans Administration, Medicaid, or Medicare. °  You generally cannot be eligible for healthcare insurance through your employer.  °  How to apply: °Eligibility screenings are held every Tuesday and Wednesday afternoon from 1:00 pm until 4:00 pm. You do not need an appointment for the interview!  °[facility] [address], [place], [place] 336-631-2330   °[facility]  336-342-8273   °[facility]  336-703-3100   °[facility]  336-570-6415   ° °Behavioral Health Resources in the Community: °Intensive Outpatient Programs °Organization         Address  Phone  Notes  °[facility] [address], [place], [place] 336-878-6098   °[facility] [address], [place], [place] 336-832-9800   °[facility] [address], [place], [place] ° 336-882-2125   °[facility] [address],  °[place], [place] 1-800-853-5163 or 336-641-4981   °Substance Abuse Resources °Organization         Address  Phone  Notes  °[facility]  336-882-2125   °[facility]  336-784-9470   °[facility]  336-285-9073   °[facility]  336-845-3988   °Residential & Outpatient Substance Abuse Program  1-800-659-3381   °Psychological Services °Organization         Address  Phone  Notes  °[facility]  336- 832-9600   °[facility]  336- 378-7881   °[facility] [address], [place] 1-800-853-5163 or 336-641-4981   ° °Mobile Crisis Teams °Organization         Address  Phone  Notes  °[facility], Mobile Crisis Care Unit  1-877-626-1772   °Assertive °[facility] ° [address] [place], [place] 336-834-9664   °Sharon DeEsch [address] °[place] 336-554-5454   ° °Self-Help/Support  Groups °Organization         Address  Phone             Notes  °[facility] - variety of support groups  336- 373-1402 Call for more information  °Narcotics Anonymous (NA), [facility] [address], °[place]  2 meetings at this location  ° °Residential Treatment Programs °Organization         Address  Phone  Notes  °[facility] [address],    °[place]  1-866-801-8205   °[facility] ° [address], [place], [place] 704-293-8524   °[facility] [address], [place] 336-845-3988 Admissions: 8am-3pm M-F  °[facility] [address]    °[place], [place] 336-841-1104   °[facility] [address], [place], [place] 336-379-7146   °[facility] [address]  °[place], [place] 336-285-9073   °[facility] - Intensive Outpatient [address], [place], [place] 336-852-3033   °[facility] [address]  °[place], [place] 1-877-615-2722 or 336-784-9470   °[facility] [address] [place], [place] 336-227-7417 Accepts Medicaid  °[facility] [address]  °[place] 1-800-659-3381 Substance Abuse/Addiction Treatment  ° °[place] Behavioral Health Resources °Organization         Address  Phone  Notes  °[facility]  (888) 581-9988   °Julie Brannon, PhD [address] [place], [place]   (336) 349-5553 or (336) 951-0000   °[facility]   [address] °[place], [place] (336) 349-4454   °[facility] [address], [place], [place] (336) 342-8316 Insurance/Medicaid/sponsorship through [facility]  °[facility] [address]                                    [place],  (336) 342-8316 Therapy/tele-psych/case  °[facility]   [address]  ° [place], [place] (336) 349-2233    °Dr. Arfeen  (336) 349-4544   °[facility] [place]  [employer] [place] [facility] 1) [address], [place] °2) [address], [place] °3)  [address], [place] (336) 349-3220 °(336) 342-7768 ° °(336) 342-8140   °[place] [facility] (336) 342-1394 or (336) 342-3537 (After Hours)    ° ° ° °

## 2015-10-31 ENCOUNTER — Emergency Department (HOSPITAL_COMMUNITY)
Admission: EM | Admit: 2015-10-31 | Discharge: 2015-10-31 | Disposition: A | Discharge status: Home / Self Care | Payer: Medicaid Other | Attending: Emergency Medicine | Admitting: Emergency Medicine

## 2015-10-31 ENCOUNTER — Encounter (HOSPITAL_COMMUNITY): Payer: Self-pay

## 2015-10-31 DIAGNOSIS — K029 Dental caries, unspecified: Secondary | ICD-10-CM | POA: Insufficient documentation

## 2015-10-31 DIAGNOSIS — F17210 Nicotine dependence, cigarettes, uncomplicated: Secondary | ICD-10-CM | POA: Insufficient documentation

## 2015-10-31 DIAGNOSIS — K0889 Other specified disorders of teeth and supporting structures: Secondary | ICD-10-CM | POA: Insufficient documentation

## 2015-10-31 DIAGNOSIS — H578 Other specified disorders of eye and adnexa: Secondary | ICD-10-CM | POA: Insufficient documentation

## 2015-10-31 DIAGNOSIS — K002 Abnormalities of size and form of teeth: Secondary | ICD-10-CM | POA: Insufficient documentation

## 2015-10-31 DIAGNOSIS — Z872 Personal history of diseases of the skin and subcutaneous tissue: Secondary | ICD-10-CM | POA: Insufficient documentation

## 2015-10-31 DIAGNOSIS — H04202 Unspecified epiphora, left lacrimal gland: Secondary | ICD-10-CM

## 2015-10-31 DIAGNOSIS — Z792 Long term (current) use of antibiotics: Secondary | ICD-10-CM | POA: Insufficient documentation

## 2015-10-31 DIAGNOSIS — Z79899 Other long term (current) drug therapy: Secondary | ICD-10-CM | POA: Insufficient documentation

## 2015-10-31 DIAGNOSIS — Z8719 Personal history of other diseases of the digestive system: Secondary | ICD-10-CM | POA: Insufficient documentation

## 2015-10-31 HISTORY — DX: Methicillin resistant Staphylococcus aureus infection, unspecified site: A49.02

## 2015-10-31 MED ORDER — PENICILLIN V POTASSIUM 500 MG PO TABS: 500.0000 mg | ORAL_TABLET | Freq: Four times a day (QID) | ORAL | Status: AC

## 2015-10-31 MED ORDER — ACETAMINOPHEN 160 MG/5ML PO SOLN
650.0000 mg | Freq: Once | ORAL | Status: AC
  Administered 2015-10-31: 650 mg via ORAL
  Filled 2015-10-31: qty 20.3

## 2015-10-31 MED ORDER — FLUORESCEIN SODIUM 1 MG OP STRP
1.0000 | ORAL_STRIP | Freq: Once | OPHTHALMIC | Status: AC
  Administered 2015-10-31: 1 via OPHTHALMIC
  Filled 2015-10-31: qty 1

## 2015-10-31 MED ORDER — PROPARACAINE HCL 0.5 % OP SOLN
1.0000 [drp] | Freq: Once | OPHTHALMIC | Status: AC
  Administered 2015-10-31: 1 [drp] via OPHTHALMIC
  Filled 2015-10-31: qty 15

## 2015-10-31 MED ORDER — ACETAMINOPHEN 325 MG PO TABS: 650.0000 mg | ORAL_TABLET | Freq: Once | ORAL | Status: DC

## 2015-10-31 MED ORDER — CETIRIZINE HCL 10 MG PO CHEW: 10.0000 mg | CHEWABLE_TABLET | Freq: Every day | ORAL | Status: DC

## 2015-10-31 MED ORDER — ERYTHROMYCIN 5 MG/GM OP OINT
TOPICAL_OINTMENT | OPHTHALMIC | Status: DC
Start: 2015-10-31 — End: 2015-11-24

## 2015-10-31 NOTE — Discharge Instructions (Signed)
Dental Abscess    A dental abscess is a collection of pus in or around a tooth.  CAUSES  This condition is caused by a bacterial infection around the root of the tooth that involves the inner part of the tooth (pulp). It may result from:  Severe tooth decay.  Trauma to the tooth that allows bacteria to enter into the pulp, such as a broken or chipped tooth.  Severe gum disease around a tooth. SYMPTOMS  Symptoms of this condition include:  Severe pain in and around the infected tooth.  Swelling and redness around the infected tooth, in the mouth, or in the face.  Tenderness.  Pus drainage.  Bad breath.  Bitter taste in the mouth.  Difficulty swallowing.  Difficulty opening the mouth.  Nausea.  Vomiting.  Chills.  Swollen neck glands.  Fever. DIAGNOSIS  This condition is diagnosed with examination of the infected tooth. During the exam, your dentist may tap on the infected tooth. Your dentist will also ask about your medical and dental history and may order X-rays.  TREATMENT  This condition is treated by eliminating the infection. This may be done with:  Antibiotic medicine.  A root canal. This may be performed to save the tooth.  Pulling (extracting) the tooth. This may also involve draining the abscess. This is done if the tooth cannot be saved. HOME CARE INSTRUCTIONS  Take medicines only as directed by your dentist.  If you were prescribed antibiotic medicine, finish all of it even if you start to feel better.  Rinse your mouth (gargle) often with salt water to relieve pain or swelling.  Do not drive or operate heavy machinery while taking pain medicine.  Do not apply heat to the outside of your mouth.  Keep all follow-up visits as directed by your dentist. This is important. SEEK MEDICAL CARE IF:  Your pain is worse and is not helped by medicine. SEEK IMMEDIATE MEDICAL CARE IF:  You have a fever or chills.  Your symptoms suddenly get worse.  You have a very bad headache.   You have problems breathing or swallowing.  You have trouble opening your mouth.  You have swelling in your neck or around your eye. This information is not intended to replace advice given to you by your health care provider. Make sure you discuss any questions you have with your health care provider.  Document Released: 10/16/2005 Document Revised: 03/02/2015 Document Reviewed: 10/13/2014  Elsevier Interactive Patient Education 2016 Elsevier Inc.  Allergic Conjunctivitis    Allergic conjunctivitis is inflammation of the clear membrane that covers the white part of your eye and the inner surface of your eyelid (conjunctiva), and it is caused by allergies. The blood vessels in the conjunctiva become inflamed, and this causes the eye to become red or pink, and it often causes itchiness in the eye. Allergic conjunctivitis cannot be spread by one person to another person (noncontagious).  CAUSES  This condition is caused by an allergic reaction. Common causes of an allergic reaction (allergens) include:  Dust.  Pollen.  Mold.  Animal dander or secretions. RISK FACTORS  This condition is more likely to develop if you are exposed to high levels of allergens that cause the allergic reaction. This might include being outdoors when air pollen levels are high or being around animals that you are allergic to.  SYMPTOMS  Symptoms of this condition may include:  Eye redness.  Tearing of the eyes.  Watery eyes.  Itchy eyes.  Burning  feeling in the eyes.  Clear drainage from the eyes.  Swollen eyelids. DIAGNOSIS  This condition may be diagnosed by medical history and physical exam. If you have drainage from your eyes, it may be tested to rule out other causes of conjunctivitis.  TREATMENT  Treatment for this condition often includes medicines. These may be eye drops, ointments, or oral medicines. They may be prescription medicines or over-the-counter medicines.  HOME CARE INSTRUCTIONS  Take or  apply medicines only as directed by your health care provider.  Do not touch or rub your eyes.  Do not wear contact lenses until the inflammation is gone. Wear glasses instead.  Do not wear eye makeup until the inflammation is gone.  Apply a cool, clean washcloth to your eye for 10-20 minutes, 3-4 times a day.  Try to avoid whatever allergen is causing the allergic reaction. SEEK MEDICAL CARE IF:  Your symptoms get worse.  You have pus draining from your eye.  You have new symptoms.  You have a fever. This information is not intended to replace advice given to you by your health care provider. Make sure you discuss any questions you have with your health care provider.  Document Released: 01/06/2003 Document Revised: 11/06/2014 Document Reviewed: 07/28/2014  Elsevier Interactive Patient Education 2016 [employer].   Emergency Department Resource Guide 1) Find a Doctor and Pay Out of Pocket Although you won't have to find out who is covered by your insurance plan, it is a good idea to ask around and get recommendations. You will then need to call the office and see if the doctor you have chosen will accept you as a new patient and what types of options they offer for patients who are self-pay. Some doctors offer discounts or will set up payment plans for their patients who do not have insurance, but you will need to ask so you aren't surprised when you get to your appointment.  2) Contact Your Local Health Department Not all health departments have doctors that can see patients for sick visits, but many do, so it is worth a call to see if yours does. If you don't know where your local health department is, you can check in your phone book. The CDC also has a tool to help you locate your state's health department, and many state websites also have listings of all of their local health departments.  3) Find a Walk-in Clinic If your illness is not likely to be very severe or complicated, you may  want to try a walk in clinic. These are popping up all over the country in pharmacies, drugstores, and shopping centers. They're usually staffed by nurse practitioners or physician assistants that have been trained to treat common illnesses and complaints. They're usually fairly quick and inexpensive. However, if you have serious medical issues or chronic medical problems, these are probably not your best option.  No Primary Care Doctor: - Call Health Connect at  [phone] - they can help you locate a primary care doctor that  accepts your insurance, provides certain services, etc. - Physician Referral Service- [phone]  Chronic Pain Problems: Organization         Address  Phone   Notes  [facility]  [phone] Patients need to be referred by their primary care doctor.   Medication Assistance: Organization         Address  Phone   Notes  [facility] Medication Assistance Program [address] [place], [place] [address] [phone](336) [phone] --Must be a resident of [place] -- Must have NO insurance coverage whatsoever (no Medicaid/ Medicare, etc.) -- The pt. MUST have a primary care doctor that directs their care regularly and follows them in the community   MedAssist  [phone]) [phone]   [employer]  [phone]    Agencies that provide inexpensive medical care: Organization         Address  Phone   Notes  [facility]  [phone]) [phone]   [facility]    [phone](336) [phone]   [facility] [address] [place], [place] [address] [phone]) [phone]   [facility] [place] [address], [place] [phone](336) [phone]   [facility]    [phone](336) [phone]   [facility]    [phone](336) [phone]   [facility]  [address], [place] Phone:  [phone], Fax:  [phone] Hours of Operation:  9 am - 6 pm, M-F.  Also accepts Medicaid/Medicare and self-pay.   [facility]  [address], [place] Phone: [phone]) [phone], Fax: [phone]. Hours of Operation:  8:30 am - 5:30 pm, M-F.  Also accepts Medicaid and self-pay.  [facility] [place] [address], [place] Phone: [phone]) [phone]   [facility] [address], [place], [place] [phone], Ext. [phone] Mondays & Thursdays: 7-9 AM.  First 15 patients are seen on a first come, first serve basis.    Medicaid-accepting [facility] Providers:  Organization         Address  Phone   Notes  [facility] [address], [place] [phone]) [phone] Also accepts self-pay patients.  [facility] [address], [place]  [phone](336) [phone]   [facility] [address], [address], [place] [phone]) [phone]   [facility] [address] [place] [address], [place] [phone](336) [phone]   Renaye RakersVeita Bland [address], [place]   [phone]) [phone] Only accepts [place] Access [place] patients after they have their name applied to their card.   Self-Pay (no insurance) in [facility]:  Organization         Address  Phone   Notes  Sickle Cell Patients, [facility] [address], [place] [phone]) [phone]   [facility] [address], [place] [phone]) [phone]   [facility] [place]  [address], [place] [phone]) [phone]   [facility]/Dr. Osei-Bonsu  [address] [place] [address], [place] or [phone], [place] [phone](336) [phone] Phone number for both [place] and [place] locations is the same.  [facility] [address], [place] [phone](336) [phone]   [facility]  [address] [place] [address], [place] or [address] [phone](336) [phone]) [phone]   [facility] [address], [place] [phone](336) [phone], phone; [phone](336)  [phone], fax Sees patients 1st and 3rd Saturday of every month.  Must not qualify for public or private insurance (i.e. Medicaid, Medicare, [place] Health Choice, Veterans' Benefits)  Household income should be no more than 200% of the poverty level The clinic cannot treat you if you are pregnant or think you are pregnant  Sexually transmitted diseases are not treated at the clinic.    Dental Care:  Organization         Address  Phone  Notes  [facility] of [facility] [address] [place], [place] [phone] Accepts children up to age 85 who are enrolled in [place] or [place] Health Choice; pregnant women with a Medicaid card; and children who have applied for Medicaid or [place] Health Choice, but were declined, whose parents can pay a reduced fee at time of service.  [facility]  [address], [place] [phone] Accepts children up to age 69 who are enrolled in [place] or [place] Health Choice; pregnant women with a Medicaid card; and children who have applied for Medicaid or [place] Health Choice, but were declined, whose parents can pay a reduced fee at time of service.  [facility]  [address] [place], [place] [phone] Patients are seen by appointment only. Walk-ins are not accepted. [facility] will see patients 18 years of age and older. Monday - Tuesday (8am-5pm) Most Wednesdays (8:30-5pm) $30 per visit, cash only  [facility] Adult Dental Access PROGRAM  [address], [facility] [phone] Patients are seen by appointment only. Walk-ins are not accepted. [facility] will see patients 21 years of age and older. One Wednesday Evening (Monthly: Volunteer Based).  $30 per visit, cash only  [employer]  [phone] for adults; Children under age 72, call Graduate Pediatric Dentistry at [phone]. Children aged 61-14, please call [phone] to request a pediatric application.  Dental services are provided in all areas of dental care including fillings, crowns and bridges, complete and partial dentures, implants, gum treatment, root canals, and extractions. Preventive care is also provided. Treatment is provided to both adults and children. Patients are selected via a lottery and there is often a waiting list.   [facility] [address] [place]  [phone] [email or web address]   [facility] [address] [place], [place] [phone], Ext. [phone] Second and Fourth Thursday of each month, opens at 6:30 AM; Clinic ends at 9 AM.  Patients are seen on a first-come first-served basis, and a limited number are seen during each clinic.   [facility]  [address] [place], [place] [phone]   Eligibility Requirements You must have lived in [place], [place], or [place] counties for at least the last three months.   You cannot be eligible for state or federal sponsored [place], including [employer], [place], or [employer].   You generally cannot be eligible for healthcare insurance through your employer.    How to apply: Eligibility screenings are held every Tuesday and Wednesday afternoon from 1:00 pm until 4:00 pm. You do not need an appointment for the interview!  [facility] [address] [place], [place] [phone]   [facility] Health Department  [phone]   [facility] Health Department  [phone]   [facility] Health Department  [phone]    Behavioral Health Resources in the Community: Intensive Outpatient Programs Organization         Address  Phone  Notes  [facility] Services [address], [place], [place] [phone]   [facility] Outpatient [address] [place], [place] [phone]   [facility] [address], [place], [place]  [phone]    [facility] 352 Mental Health [address]  [place],  [place] [phone] or [phone]   Substance Abuse Resources Organization         Address  Phone  Notes  [facility]  [phone]   [facility]  [phone]   [facility]  [phone]   [facility]  [phone]   Residential & Outpatient Substance Abuse Program  [phone]   Psychological Services Organization         Address  Phone  Notes  [facility]  [phone]- [phone]   [facility]  [phone]- [phone]   [facility] [address], [place] [phone] or [phone]    Mobile Crisis Teams Organization         Address  Phone  Notes  [facility], Mobile Crisis Care Unit  [phone]   [facility]  [address] [place], [place] [phone]   Doristine LocksSharon DeEsch [address] [place] [phone]    Self-Help/Support Groups Organization         Address  Phone             Notes  [facility] [place] - variety of support groups  336- [phone] Call for more information  Narcotics Anonymous (NA), [facility]aring [facility] [address], [place]  2 meetings at this location   Statisticianesidential Treatment Programs Organization         Address  Phone  Notes  [facility] [address],    [place]  [phone]   [facility]  [address], [place] [phone], [place], [place] [phone]   [facility] [address], [place] [phone] Admissions: 8am-3pm M-F  [facility] [address]    [place], [place] [phone]   [facility] [address], [place], [place] [phone]   [facility] [address]  [place], [place] [phone]   [facility] - Intensive Outpatient [address], [place], [place] [phone]   [facility] ([facility] [address]  [place], [place] [phone] or [phone]   [facility] (RT[facility]) [address] [place], [place] [phone] Accepts Medicaid  [facility] [address]  [place] [phone] Substance Abuse/Addiction Treatment   [facility] Behavioral Health Resources Organization         Address  Phone  Notes  [facility]  [phone]) [phone]   Angie FavaJulie Brannon, PhD [address], [address] [place], [place]   [phone](336) [phone] or [phone]) [phone]   [facility]   [address] [place], [place] [phone](336) [phone]   [facility] [address], [place], [place] [phone](336) [phone] Insurance/Medicaid/sponsorship through [facility]  [facility] [address]                                    [place], [place] [phone]) [phone] Therapy/tele-psych/case  [facility] [address].   [place], [place] [phone](336) [phone]    Dr. Lolly MustacheArfeen  [phone](336) [phone]   [facility] [place]  [employer] [facility] 1) [address],  2) [address], [place] 3)  [address], [place] [phone](336) [phone](336) [phone]  [phone](336) [phone]   [facility] Child Abuse Hotline [phone](336) 9803289913 or [phone](336) [phone] (After Hours)

## 2015-10-31 NOTE — ED Notes (Signed)
Pt presents with c/o dental pain and eye swelling. Pt reports she believes she has an abscessed tooth. Pt also reports that she woke up this morning and every time she opens her eye, her eye is watering. Significant swelling to that left eye area.

## 2015-10-31 NOTE — ED Provider Notes (Signed)
CSN: [id number]     Arrival date & time 10/31/15  1016 History   First MD Initiated Contact with Patient 10/31/15 1023     Chief Complaint  Patient presents with  . Dental Pain  . Eye Problem     (Consider location/radiation/quality/duration/timing/severity/associated sxs/prior Treatment) Patient is a 25 y.o. female presenting with tooth pain and eye problem.  Dental Pain Location:  Upper Quality:  Aching Severity:  Moderate Onset quality:  Gradual Duration:  2 days Timing:  Constant Progression:  Worsening Chronicity:  New Context: dental caries   Relieved by:  None tried Worsened by:  Nothing tried Ineffective treatments:  None tried Associated symptoms: facial pain and facial swelling (mild)   Associated symptoms: no congestion, no difficulty swallowing, no drooling, no fever, no headaches, no neck pain, no neck swelling and no trismus   Risk factors: lack of dental care   Eye Problem Location:  L eye Quality:  Unable to specify Severity:  Mild Onset quality:  Sudden Duration: this morning. Timing:  Constant Progression:  Unchanged Chronicity:  New Context: not burn, not contact lens problem, not direct trauma, not foreign body and not scratch   Relieved by:  Closing eye Worsened by:  Nothing tried Ineffective treatments:  None tried Associated symptoms: swelling (mild) and tearing   Associated symptoms: no blurred vision, no crusting, no decreased vision, no discharge, no double vision, no foreign body sensation, no headaches, no itching, no nausea, no photophobia, no redness and no vomiting    Alexandra Ramsey is a 25 y.o. female with PMH significant for UC who presents with dental pain x 2 days and painless left eye watering and mild swelling since this morning.  No discharge or crusting.  She reports no visual changes.  She does not wear contacts.  She does not have a dentist.  Denies fever, N/V, abdominal pain, difficulty swallowing.  No recent trauma or injury.     Past Medical History  Diagnosis Date  . Ulcerative colitis 05/29/08 & 04/29/10    Colonoscopy with biopsy Dx'd  . Cellulitis and abscess of neck 12/22/2014    right  . History of blood transfusion     "related to ulcerative colitis"   Past Surgical History  Procedure Laterality Date  . Colonoscopy w/ biopsies  05/29/08 and 05/08/10    Biopsy consistent with colitis  . Tonsillectomy and adenoidectomy  ~ 2001  . Mass biopsy Right 12/23/2014    Procedure: incision and drainage right neck abscess;  Surgeon: Osborn Coho, MD;  Location: [facility];  Service: ENT;  Laterality: Right;   Family History  Problem Relation Age of Onset  . Colon polyps Father   . Hypertension Father   . Colon polyps Maternal Grandmother   . Colon polyps Paternal Grandmother   . Inflammatory bowel disease Neg Hx   . Hypertension Mother    Social History  Substance Use Topics  . Smoking status: Current Every Day Smoker -- 0.50 packs/day for 7 years    Types: Cigarettes  . Smokeless tobacco: Never Used  . Alcohol Use: 1.2 oz/week    2 Cans of beer per week   OB History    No data available     Review of Systems  Constitutional: Negative for fever.  HENT: Positive for dental problem and facial swelling (mild). Negative for congestion, drooling and trouble swallowing.   Eyes: Negative for blurred vision, double vision, photophobia, pain, discharge, redness, itching and visual disturbance.  Respiratory: Negative for  shortness of breath.   Cardiovascular: Negative for chest pain.  Gastrointestinal: Negative for nausea, vomiting and abdominal pain.  Musculoskeletal: Negative for neck pain.  Neurological: Negative for headaches.  All other systems reviewed and are negative.     Allergies  Ibuprofen  Home Medications   Prior to Admission medications   Medication Sig Start Date End Date Taking? Authorizing Provider  amoxicillin (AMOXIL) 500 MG capsule Take 1 capsule (500 mg total) by mouth 3 (three)  times daily. 03/04/15   Rodolph Bong, MD  amoxicillin-clavulanate (AUGMENTIN) 875-125 MG per tablet Take 1 tablet by mouth 2 (two) times daily. Patient not taking: Reported on 03/04/2015 12/25/14   Conley Canal, MD  Aspirin-Salicylamide-Caffeine (BC HEADACHE PO) Take 1 each by mouth as needed (headache).    Historical Provider, MD  betamethasone dipropionate (DIPROLENE) 0.05 % ointment Apply topically 2 (two) times daily. 03/04/15   Rodolph Bong, MD  HYDROcodone-acetaminophen (NORCO/VICODIN) 5-325 MG per tablet Take 1-2 tablets by mouth every 4 (four) hours as needed for moderate pain. 12/25/14   Simbiso Ranga, MD  naproxen sodium (ANAPROX) 220 MG tablet Take 440 mg by mouth daily as needed (pain).    Historical Provider, MD  predniSONE (DELTASONE) 10 MG tablet Take 2 tablets (20 mg total) by mouth daily. Prednisone dose pack directions:   6 tabs on day one, 5 tabs on day two, 4 tabs on day three, 3 tabs on day four, 2 tabs on day five, 1 tab on day six. Disp # 21  Marlon Pel, PA-C Patient not taking: Reported on 12/22/2014 11/28/14   Marlon Pel, PA-C  traMADol (ULTRAM) 50 MG tablet Take 1 tablet (50 mg total) by mouth once. 06/12/15   Earley Favor, NP   BP 117/79 mmHg  Pulse 78  Temp(Src) 98 F (36.7 C) (Oral)  Resp 18  SpO2 100%  LMP 10/22/2015 (Approximate) Physical Exam  Constitutional: She is oriented to person, place, and time. She appears well-developed and well-nourished.  HENT:  Head: Normocephalic and atraumatic.  Mouth/Throat: Uvula is midline, oropharynx is clear and moist and mucous membranes are normal. No trismus in the jaw. Abnormal dentition. Dental caries present.  No tongue swelling.  Mild left facial swelling.  Airway patent.  Patient tolerating secretions without difficulty.   Eyes: Conjunctivae, EOM and lids are normal. Pupils are equal, round, and reactive to light. Lids are everted and swept, no foreign bodies found. Right eye exhibits no chemosis, no discharge and no  exudate. Left eye exhibits no chemosis, no discharge, no exudate and no hordeolum. No foreign body present in the left eye. Right conjunctiva is not injected. Right conjunctiva has no hemorrhage. Left conjunctiva is not injected. Left conjunctiva has no hemorrhage.  Slit lamp exam:      The right eye shows no fluorescein uptake.       The left eye shows no corneal abrasion, no corneal flare, no corneal ulcer, no foreign body and no hyphema.  Mild left upper lid swelling.  No erythema.  Non-tender to palpation.  No discharge or drainage.  Neck: Normal range of motion. Neck supple.  No evidence of Ludwigs angina.  Cardiovascular: Normal rate, regular rhythm and normal heart sounds.   Pulmonary/Chest: Effort normal and breath sounds normal.  Abdominal: Bowel sounds are normal. She exhibits no distension.  Musculoskeletal:  Moves all extremities spontaneously.  Lymphadenopathy:    She has no cervical adenopathy.  Neurological: She is alert and oriented to person, place, and time.  Speech  clear without dysarthria.   Skin: Skin is warm and dry.    ED Course  Procedures (including critical care time) Labs Review Labs Reviewed - No data to display  Imaging Review No results found. I have personally reviewed and evaluated these images and lab results as part of my medical decision-making.   EKG Interpretation None      MDM   Final diagnoses:  Pain, dental  Watering of left eye    Patient presents with dental pain and left eye watering.  No fever, chills, N/V, neck swelling, tongue swelling, difficulty swallowing, or trismus.  VSS, NAD.  On exam, mild left facial swelling and left upper molar TTP.  No drainage or erythema.  No signs of Ludwig angina.  Patient tolerating secretions well.  Left eye watering.  No discharge or crusting.  No visualization of FB with lids everted.  Conjunctiva not injected.  No fluorescein uptake. Doubt orbital cellulitis. Possible conjunctivitis.  Will give  erythromycin ointment and zyrtec and penicillin for possible dental abscess.  Will give resource list, and stressed importance of dental follow up.  Evaluation does not show pathology requiring ongoing emergent intervention or admission. Pt is hemodynamically stable and mentating appropriately. Discussed findings/results and plan with patient/guardian, who agrees with plan. All questions answered. Return precautions discussed and outpatient follow up given.      Cheri FowlerKayla Kenji Mapel, PA-C 10/31/15 1102  Linwood DibblesJon Knapp, MD 11/01/15 [phone]

## 2015-11-24 ENCOUNTER — Encounter (HOSPITAL_COMMUNITY): Payer: Self-pay

## 2015-11-24 ENCOUNTER — Emergency Department (HOSPITAL_COMMUNITY)
Admission: EM | Admit: 2015-11-24 | Discharge: 2015-11-24 | Disposition: A | Discharge status: Home / Self Care | Payer: Medicaid Other | Attending: Emergency Medicine | Admitting: Emergency Medicine

## 2015-11-24 DIAGNOSIS — F17210 Nicotine dependence, cigarettes, uncomplicated: Secondary | ICD-10-CM | POA: Insufficient documentation

## 2015-11-24 DIAGNOSIS — R197 Diarrhea, unspecified: Secondary | ICD-10-CM | POA: Insufficient documentation

## 2015-11-24 DIAGNOSIS — R112 Nausea with vomiting, unspecified: Secondary | ICD-10-CM | POA: Insufficient documentation

## 2015-11-24 DIAGNOSIS — Z8614 Personal history of Methicillin resistant Staphylococcus aureus infection: Secondary | ICD-10-CM | POA: Insufficient documentation

## 2015-11-24 DIAGNOSIS — R1030 Lower abdominal pain, unspecified: Secondary | ICD-10-CM | POA: Insufficient documentation

## 2015-11-24 DIAGNOSIS — Z3202 Encounter for pregnancy test, result negative: Secondary | ICD-10-CM | POA: Insufficient documentation

## 2015-11-24 LAB — COMPREHENSIVE METABOLIC PANEL
ALK PHOS: 78 U/L (ref 38–126)
ALT: 14 U/L (ref 14–54)
ANION GAP: 7 (ref 5–15)
AST: 19 U/L (ref 15–41)
Albumin: 4.2 g/dL (ref 3.5–5.0)
BUN: 12 mg/dL (ref 6–20)
CALCIUM: 9.1 mg/dL (ref 8.9–10.3)
CO2: 21 mmol/L — AB (ref 22–32)
Chloride: 110 mmol/L (ref 101–111)
Creatinine, Ser: 0.86 mg/dL (ref 0.44–1.00)
Glucose, Bld: 87 mg/dL (ref 65–99)
Potassium: 4.3 mmol/L (ref 3.5–5.1)
SODIUM: 138 mmol/L (ref 135–145)
Total Bilirubin: 0.9 mg/dL (ref 0.3–1.2)
Total Protein: 7.7 g/dL (ref 6.5–8.1)

## 2015-11-24 LAB — CBC
HCT: 41.1 % (ref 36.0–46.0)
Hemoglobin: 13.4 g/dL (ref 12.0–15.0)
MCH: 26.5 pg (ref 26.0–34.0)
MCHC: 32.6 g/dL (ref 30.0–36.0)
MCV: 81.2 fL (ref 78.0–100.0)
Platelets: 233 10*3/uL (ref 150–400)
RBC: 5.06 MIL/uL (ref 3.87–5.11)
RDW: 14.3 % (ref 11.5–15.5)
WBC: 6.3 10*3/uL (ref 4.0–10.5)

## 2015-11-24 LAB — I-STAT BETA HCG BLOOD, ED (MC, WL, AP ONLY): I-stat hCG, quantitative: <5 m[IU]/mL (ref <5)

## 2015-11-24 LAB — URINALYSIS, ROUTINE W REFLEX MICROSCOPIC
Bilirubin Urine: NEGATIVE
Glucose, UA: NEGATIVE mg/dL
HGB URINE DIPSTICK: NEGATIVE
Ketones, ur: NEGATIVE mg/dL
Nitrite: NEGATIVE
PROTEIN: NEGATIVE mg/dL
Specific Gravity, Urine: 1.024 (ref 1.005–1.030)
pH: 5 (ref 5.0–8.0)

## 2015-11-24 LAB — URINE MICROSCOPIC-ADD ON
Bacteria, UA: NONE SEEN
WBC UA: NONE SEEN WBC/hpf (ref 0–5)

## 2015-11-24 LAB — LIPASE, BLOOD: Lipase: 24 U/L (ref 11–51)

## 2015-11-24 MED ORDER — ONDANSETRON HCL 4 MG/2ML IJ SOLN
4.0000 mg | Freq: Once | INTRAMUSCULAR | Status: AC | PRN
  Administered 2015-11-24: 4 mg via INTRAVENOUS
  Filled 2015-11-24: qty 2

## 2015-11-24 MED ORDER — ONDANSETRON 4 MG PO TBDP: 4.0000 mg | ORAL_TABLET | Freq: Three times a day (TID) | ORAL | Status: DC | PRN

## 2015-11-24 MED ORDER — MORPHINE SULFATE (PF) 4 MG/ML IV SOLN
4.0000 mg | Freq: Once | INTRAVENOUS | Status: AC
  Administered 2015-11-24: 4 mg via INTRAVENOUS
  Filled 2015-11-24: qty 1

## 2015-11-24 MED ORDER — ONDANSETRON 4 MG PO TBDP
ORAL_TABLET | ORAL | Status: AC
  Filled 2015-11-24: qty 1

## 2015-11-24 MED ORDER — ONDANSETRON 4 MG PO TBDP
4.0000 mg | ORAL_TABLET | Freq: Once | ORAL | Status: AC | PRN
  Administered 2015-11-24: 4 mg via ORAL

## 2015-11-24 MED ORDER — DICYCLOMINE HCL 10 MG PO CAPS
10.0000 mg | ORAL_CAPSULE | Freq: Once | ORAL | Status: AC
  Administered 2015-11-24: 10 mg via ORAL
  Filled 2015-11-24: qty 1

## 2015-11-24 MED ORDER — SODIUM CHLORIDE 0.9 % IV BOLUS (SEPSIS)
1000.0000 mL | Freq: Once | INTRAVENOUS | Status: AC
  Administered 2015-11-24: 1000 mL via INTRAVENOUS

## 2015-11-24 MED ORDER — PROMETHAZINE HCL 25 MG/ML IJ SOLN
25.0000 mg | Freq: Once | INTRAMUSCULAR | Status: DC
  Filled 2015-11-24: qty 1

## 2015-11-24 MED ORDER — DICYCLOMINE HCL 20 MG PO TABS: 20.0000 mg | ORAL_TABLET | Freq: Two times a day (BID) | ORAL | Status: DC

## 2015-11-24 NOTE — ED Notes (Signed)
Pt. Presents with complaint of central lower abd pain starting last night and worsening this AM associated with diarrhea. Pt. States she has ulcerative colitis. Pt. Endorses nausea with no vomiting.

## 2015-11-24 NOTE — ED Notes (Signed)
Pt actively vomiting on floor at this time. Received zofran ODT in triage, reports no relief of nausea.

## 2015-11-24 NOTE — ED Provider Notes (Signed)
CSN: [id number]     Arrival date & time 11/24/15  1010 History   First MD Initiated Contact with Patient 11/24/15 1231     Chief Complaint  Patient presents with  . Abdominal Pain     (Consider location/radiation/quality/duration/timing/severity/associated sxs/prior Treatment) HPI   Alexandra Ramsey is a 25 y.o. female, with a history of ulcerative colitis, presenting to the ED with abdominal pain that began last night and vomiting and diarrhea that began this morning. Patient states that this does not feel like her ulcerative colitis flareups. Patient states her abdominal pain is located central inferior to the umbilicus, sharp in nature, rates it 8 out of 10, nonradiating. Patient states the pain has not moved since it began. Patient denies fever/chills, hematochezia or melena, urinary issues, or any other complaints.    Past Medical History  Diagnosis Date  . Ulcerative colitis 05/29/08 & 04/29/10    Colonoscopy with biopsy Dx'd  . Cellulitis and abscess of neck 12/22/2014    right  . History of blood transfusion     "related to ulcerative colitis"  . MRSA (methicillin resistant Staphylococcus aureus)     patient stated   Past Surgical History  Procedure Laterality Date  . Colonoscopy w/ biopsies  05/29/08 and 05/08/10    Biopsy consistent with colitis  . Tonsillectomy and adenoidectomy  ~ 2001  . Mass biopsy Right 12/23/2014    Procedure: incision and drainage right neck abscess;  Surgeon: Osborn Coho, MD;  Location: [facility] OR;  Service: ENT;  Laterality: Right;  . Wisdom tooth extraction     Family History  Problem Relation Age of Onset  . Colon polyps Father   . Hypertension Father   . Colon polyps Maternal Grandmother   . Colon polyps Paternal Grandmother   . Inflammatory bowel disease Neg Hx   . Hypertension Mother    Social History  Substance Use Topics  . Smoking status: Current Every Day Smoker -- 0.50 packs/day for 7 years    Types: Cigarettes  . Smokeless  tobacco: Never Used  . Alcohol Use: 1.2 oz/week    2 Cans of beer per week     Comment: socially   OB History    No data available     Review of Systems  Constitutional: Negative for fever, chills and diaphoresis.  Respiratory: Negative for shortness of breath.   Cardiovascular: Negative for chest pain.  Gastrointestinal: Positive for nausea, vomiting, abdominal pain and diarrhea.  Genitourinary: Negative for dysuria, hematuria, vaginal discharge and vaginal pain.  All other systems reviewed and are negative.     Allergies  Ibuprofen  Home Medications   Prior to Admission medications   Medication Sig Start Date End Date Taking? Authorizing Provider  dicyclomine (BENTYL) 20 MG tablet Take 1 tablet (20 mg total) by mouth 2 (two) times daily. 11/24/15   Shawn C Joy, PA-C  ondansetron (ZOFRAN ODT) 4 MG disintegrating tablet Take 1 tablet (4 mg total) by mouth every 8 (eight) hours as needed for nausea or vomiting. 11/24/15   Shawn C Joy, PA-C   BP 132/90 mmHg  Pulse 77  Temp(Src) 98.1 F (36.7 C)  Resp 20  SpO2 100%  LMP 10/22/2015 Physical Exam  Constitutional: She appears well-developed and well-nourished. No distress.  HENT:  Head: Normocephalic and atraumatic.  Eyes: Conjunctivae are normal. Pupils are equal, round, and reactive to light.  Cardiovascular: Normal rate, regular rhythm and normal heart sounds.   Pulmonary/Chest: Effort normal and breath sounds normal.  No respiratory distress.  Abdominal: Soft. Bowel sounds are normal. There is tenderness in the suprapubic area.  Musculoskeletal: She exhibits no edema or tenderness.  Neurological: She is alert.  Skin: Skin is warm and dry. She is not diaphoretic.  Nursing note and vitals reviewed.   ED Course  Procedures (including critical care time) Labs Review Labs Reviewed  COMPREHENSIVE METABOLIC PANEL - Abnormal; Notable for the following:    CO2 21 (*)    All other components within normal limits   URINALYSIS, ROUTINE W REFLEX MICROSCOPIC (NOT AT [facility]) - Abnormal; Notable for the following:    APPearance TURBID (*)    Leukocytes, UA TRACE (*)    All other components within normal limits  URINE MICROSCOPIC-ADD ON - Abnormal; Notable for the following:    Squamous Epithelial / LPF 0-5 (*)    All other components within normal limits  WET PREP, GENITAL  LIPASE, BLOOD  CBC  I-STAT BETA HCG BLOOD, ED ([facility], [facility], [facility] ONLY)  GC/CHLAMYDIA PROBE AMP ([place]) NOT AT [facility]    Imaging Review No results found. I have personally reviewed and evaluated these lab results as part of my medical decision-making.   EKG Interpretation None      MDM   Final diagnoses:  Lower abdominal pain  Non-intractable vomiting with nausea, vomiting of unspecified type  Diarrhea, unspecified type    Alexandra Ramsey presents with abdominal pain since yesterday and nausea, vomiting, and diarrhea since this morning.  Findings and plan of care discussed with [name], MD.  This patient's presentation does not seem to be consistent with a clinical appendicitis. This patient's presentation does not seem to be consistent with her ulcerative colitis. Patient is nontoxic appearing, not tachycardic, is afebrile, not tachypneic, is normotensive, maintains SPO2 of 100% on room air, and is in no apparent distress. Upon my initial interview, patient was noted to be asleep on the bed, resting comfortably. Pt feels much better after the fluids, zofran, morphine, and bentyl. Specifically denies vaginal discharge or abnormal bleeding. Pt very strongly requests that we don't do a pelvic exam. Pros and Cons of pelvic exam were discussed with the patient. Pt still declined the exam. Upon reassessment the patient had a significant decrease in her abdominal discomfort and tenderness and states that she feels well enough to go home. The patient was given instructions for home care as well as return precautions. Patient  voices understanding of these instructions, accepts the plan, and is comfortable with discharge.  Filed Vitals:   11/24/15 1021  BP: 132/90  Pulse: 77  Temp: 98.1 F (36.7 C)  Resp: 20  SpO2: 100%     Anselm Pancoast, PA-C 11/24/15 1524  [name], MD 11/25/15 1029

## 2015-11-24 NOTE — ED Notes (Signed)
Patient told tech she is refusing the pelvic exam. Did not set up cart.

## 2015-11-24 NOTE — Discharge Instructions (Signed)
You have been seen today for abdominal pain, nausea, vomiting, and diarrhea. Your lab tests showed no abnormalities. Follow up with PCP as needed. Return to ED should symptoms worsen. Get plenty of rest and drink plenty of fluids. Zofran for nausea. Bentyl for abdominal discomfort. Return to the ED or go to your PCP should your symptoms get worse, you begin to have new symptoms, or you have other concerns.

## 2015-11-25 ENCOUNTER — Encounter (HOSPITAL_BASED_OUTPATIENT_CLINIC_OR_DEPARTMENT_OTHER): Payer: Self-pay | Admitting: Emergency Medicine

## 2016-05-05 ENCOUNTER — Encounter (HOSPITAL_COMMUNITY): Payer: Self-pay | Admitting: Emergency Medicine

## 2016-05-05 ENCOUNTER — Emergency Department (HOSPITAL_COMMUNITY)
Admission: EM | Admit: 2016-05-05 | Discharge: 2016-05-05 | Disposition: A | Discharge status: Home / Self Care | Payer: Medicaid Other | Attending: Emergency Medicine | Admitting: Emergency Medicine

## 2016-05-05 ENCOUNTER — Emergency Department (HOSPITAL_COMMUNITY): Payer: Medicaid Other

## 2016-05-05 DIAGNOSIS — Y9389 Activity, other specified: Secondary | ICD-10-CM | POA: Insufficient documentation

## 2016-05-05 DIAGNOSIS — X501XXA Overexertion from prolonged static or awkward postures, initial encounter: Secondary | ICD-10-CM | POA: Insufficient documentation

## 2016-05-05 DIAGNOSIS — F17210 Nicotine dependence, cigarettes, uncomplicated: Secondary | ICD-10-CM | POA: Insufficient documentation

## 2016-05-05 DIAGNOSIS — Y999 Unspecified external cause status: Secondary | ICD-10-CM | POA: Insufficient documentation

## 2016-05-05 DIAGNOSIS — S40022A Contusion of left upper arm, initial encounter: Secondary | ICD-10-CM | POA: Insufficient documentation

## 2016-05-05 DIAGNOSIS — Y929 Unspecified place or not applicable: Secondary | ICD-10-CM | POA: Insufficient documentation

## 2016-05-05 DIAGNOSIS — S5002XA Contusion of left elbow, initial encounter: Secondary | ICD-10-CM

## 2016-05-05 MED ORDER — ACETAMINOPHEN 500 MG PO TABS
1000.0000 mg | ORAL_TABLET | Freq: Once | ORAL | Status: AC
  Administered 2016-05-05: 1000 mg via ORAL
  Filled 2016-05-05: qty 2

## 2016-05-05 NOTE — Discharge Instructions (Signed)
Your xray showed no fractures. You have a large bruise called a hematoma. Please apply ice 20 min at a time several times a day. Take tylenol as directed. Follow up with your primary care doctor.   Cryotherapy Cryotherapy means treatment with cold. Ice or gel packs can be used to reduce both pain and swelling. Ice is the most helpful within the first 24 to 48 hours after an injury or flare-up from overusing a muscle or joint. Sprains, strains, spasms, burning pain, shooting pain, and aches can all be eased with ice. Ice can also be used when recovering from surgery. Ice is effective, has very few side effects, and is safe for most people to use. PRECAUTIONS  Ice is not a safe treatment option for people with:  Raynaud phenomenon. This is a condition affecting small blood vessels in the extremities. Exposure to cold may cause your problems to return.  Cold hypersensitivity. There are many forms of cold hypersensitivity, including:  Cold urticaria. Red, itchy hives appear on the skin when the tissues begin to warm after being iced.  Cold erythema. This is a red, itchy rash caused by exposure to cold.  Cold hemoglobinuria. Red blood cells break down when the tissues begin to warm after being iced. The hemoglobin that carry oxygen are passed into the urine because they cannot combine with blood proteins fast enough.  Numbness or altered sensitivity in the area being iced. If you have any of the following conditions, do not use ice until you have discussed cryotherapy with your caregiver:  Heart conditions, such as arrhythmia, angina, or chronic heart disease.  High blood pressure.  Healing wounds or open skin in the area being iced.  Current infections.  Rheumatoid arthritis.  Poor circulation.  Diabetes. Ice slows the blood flow in the region it is applied. This is beneficial when trying to stop inflamed tissues from spreading irritating chemicals to surrounding tissues. However, if  you expose your skin to cold temperatures for too long or without the proper protection, you can damage your skin or nerves. Watch for signs of skin damage due to cold. HOME CARE INSTRUCTIONS Follow these tips to use ice and cold packs safely.  Place a dry or damp towel between the ice and skin. A damp towel will cool the skin more quickly, so you may need to shorten the time that the ice is used.  For a more rapid response, add gentle compression to the ice.  Ice for no more than 10 to 20 minutes at a time. The bonier the area you are icing, the less time it will take to get the benefits of ice.  Check your skin after 5 minutes to make sure there are no signs of a poor response to cold or skin damage.  Rest 20 minutes or more between uses.  Once your skin is numb, you can end your treatment. You can test numbness by very lightly touching your skin. The touch should be so light that you do not see the skin dimple from the pressure of your fingertip. When using ice, most people will feel these normal sensations in this order: cold, burning, aching, and numbness.  Do not use ice on someone who cannot communicate their responses to pain, such as small children or people with dementia. HOW TO MAKE AN ICE PACK Ice packs are the most common way to use ice therapy. Other methods include ice massage, ice baths, and cryosprays. Muscle creams that cause a cold, tingly feeling  do not offer the same benefits that ice offers and should not be used as a substitute unless recommended by your caregiver. To make an ice pack, do one of the following:  Place crushed ice or a bag of frozen vegetables in a sealable plastic bag. Squeeze out the excess air. Place this bag inside another plastic bag. Slide the bag into a pillowcase or place a damp towel between your skin and the bag.  Mix 3 parts water with 1 part rubbing alcohol. Freeze the mixture in a sealable plastic bag. When you remove the mixture from the  freezer, it will be slushy. Squeeze out the excess air. Place this bag inside another plastic bag. Slide the bag into a pillowcase or place a damp towel between your skin and the bag. SEEK MEDICAL CARE IF:  You develop white spots on your skin. This may give the skin a blotchy (mottled) appearance.  Your skin turns blue or pale.  Your skin becomes waxy or hard.  Your swelling gets worse. MAKE SURE YOU:   Understand these instructions.  Will watch your condition.  Will get help right away if you are not doing well or get worse.   This information is not intended to replace advice given to you by your health care provider. Make sure you discuss any questions you have with your health care provider.   Document Released: 06/12/2011 Document Revised: 11/06/2014 Document Reviewed: 06/12/2011 Elsevier Interactive Patient Education 2016 Elsevier Inc.   Elbow Contusion An elbow contusion is a deep bruise of the elbow. Contusions are the result of an injury that caused bleeding under the skin. The contusion may turn blue, purple, or yellow. Minor injuries will give you a painless contusion, but more severe contusions may stay painful and swollen for a few weeks.  CAUSES  An elbow contusion comes from a direct force to that area, such as falling on the elbow. SYMPTOMS   Swelling and redness of the elbow.  Bruising of the elbow area.  Tenderness or soreness of the elbow. DIAGNOSIS  You will have a physical exam and will be asked about your history. You may need an X-ray of your elbow to look for a broken bone (fracture).  TREATMENT  A sling or splint may be needed to support your injury. Resting, elevating, and applying cold compresses to the elbow area are often the best treatments for an elbow contusion. Over-the-counter medicines may also be recommended for pain control. HOME CARE INSTRUCTIONS   Put ice on the injured area.  Put ice in a plastic bag.  Place a towel between your  skin and the bag.  Leave the ice on for 15-20 minutes, 03-04 times a day.  Only take over-the-counter or prescription medicines for pain, discomfort, or fever as directed by your caregiver.  Rest your injured elbow until the pain and swelling are better.  Elevate your elbow to reduce swelling.  Apply a compression wrap as directed by your caregiver. This can help reduce swelling and motion. You may remove the wrap for sleeping, showers, and baths. If your fingers become numb, cold, or blue, take the wrap off and reapply it more loosely.  Use your elbow only as directed by your caregiver. You may be asked to do range of motion exercises. Do them as directed.  See your caregiver as directed. It is very important to keep all follow-up appointments in order to avoid any long-term problems with your elbow, including chronic pain or inability to move  your elbow normally. SEEK IMMEDIATE MEDICAL CARE IF:   You have increased redness, swelling, or pain in your elbow.  Your swelling or pain is not relieved with medicines.  You have swelling of the hand and fingers.  You are unable to move your fingers or wrist.  You begin to lose feeling in your hand or fingers.  Your fingers or hand become cold or blue. MAKE SURE YOU:   Understand these instructions.  Will watch your condition.  Will get help right away if you are not doing well or get worse.   This information is not intended to replace advice given to you by your health care provider. Make sure you discuss any questions you have with your health care provider.   Document Released: 09/24/2006 Document Revised: 01/08/2012 Document Reviewed: 05/31/2015 Elsevier Interactive Patient Education [employer] Elsevier Inc.

## 2016-05-05 NOTE — ED Notes (Signed)
PT DISCHARGED. INSTRUCTIONS GIVEN. AAOX4. PT IN NO APPARENT DISTRESS. THE OPPORTUNITY TO ASK QUESTIONS WAS PROVIDED. 
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
## 2016-12-15 ENCOUNTER — Encounter (HOSPITAL_COMMUNITY): Payer: Self-pay

## 2016-12-15 ENCOUNTER — Emergency Department (HOSPITAL_COMMUNITY): Payer: Self-pay

## 2016-12-15 ENCOUNTER — Emergency Department (HOSPITAL_COMMUNITY)
Admission: EM | Admit: 2016-12-15 | Discharge: 2016-12-15 | Disposition: A | Discharge status: Home / Self Care | Payer: Self-pay | Attending: Emergency Medicine | Admitting: Emergency Medicine

## 2016-12-15 DIAGNOSIS — Z23 Encounter for immunization: Secondary | ICD-10-CM | POA: Insufficient documentation

## 2016-12-15 DIAGNOSIS — S0990XA Unspecified injury of head, initial encounter: Secondary | ICD-10-CM

## 2016-12-15 DIAGNOSIS — Y929 Unspecified place or not applicable: Secondary | ICD-10-CM | POA: Insufficient documentation

## 2016-12-15 DIAGNOSIS — F17210 Nicotine dependence, cigarettes, uncomplicated: Secondary | ICD-10-CM | POA: Insufficient documentation

## 2016-12-15 DIAGNOSIS — Y999 Unspecified external cause status: Secondary | ICD-10-CM | POA: Insufficient documentation

## 2016-12-15 DIAGNOSIS — Y939 Activity, unspecified: Secondary | ICD-10-CM | POA: Insufficient documentation

## 2016-12-15 DIAGNOSIS — S0181XA Laceration without foreign body of other part of head, initial encounter: Secondary | ICD-10-CM | POA: Insufficient documentation

## 2016-12-15 MED ORDER — ACETAMINOPHEN 500 MG PO TABS: 500.0000 mg | ORAL_TABLET | Freq: Four times a day (QID) | ORAL | 0 refills | Status: DC | PRN

## 2016-12-15 MED ORDER — BACITRACIN ZINC 500 UNIT/GM EX OINT: 1.0000 "application " | TOPICAL_OINTMENT | Freq: Two times a day (BID) | CUTANEOUS | 1 refills | Status: DC

## 2016-12-15 MED ORDER — TETANUS-DIPHTH-ACELL PERTUSSIS 5-2.5-18.5 LF-MCG/0.5 IM SUSP
0.5000 mL | Freq: Once | INTRAMUSCULAR | Status: AC
  Administered 2016-12-15: 0.5 mL via INTRAMUSCULAR
  Filled 2016-12-15: qty 0.5

## 2016-12-15 MED ORDER — ACETAMINOPHEN 325 MG PO TABS
650.0000 mg | ORAL_TABLET | Freq: Once | ORAL | Status: AC
  Administered 2016-12-15: 650 mg via ORAL
  Filled 2016-12-15: qty 2

## 2016-12-15 MED ORDER — LIDOCAINE-EPINEPHRINE (PF) 2 %-1:200000 IJ SOLN
20.0000 mL | Freq: Once | INTRAMUSCULAR | Status: AC
  Administered 2016-12-15: 20 mL
  Filled 2016-12-15: qty 20

## 2016-12-15 MED ORDER — LIDOCAINE-EPINEPHRINE-TETRACAINE (LET) SOLUTION
3.0000 mL | Freq: Once | NASAL | Status: DC
  Filled 2016-12-15: qty 3

## 2016-12-15 MED ORDER — PREDNISONE 50 MG PO TABS: 250.0000 mg | ORAL_TABLET | Freq: Once | ORAL | Status: DC
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
## 2018-06-17 ENCOUNTER — Other Ambulatory Visit: Payer: Self-pay

## 2018-06-17 ENCOUNTER — Ambulatory Visit (HOSPITAL_COMMUNITY)
Admission: EM | Admit: 2018-06-17 | Discharge: 2018-06-17 | Disposition: A | Discharge status: Home / Self Care | Payer: Self-pay | Attending: Family Medicine | Admitting: Family Medicine

## 2018-06-17 ENCOUNTER — Encounter (HOSPITAL_COMMUNITY): Payer: Self-pay | Admitting: Family Medicine

## 2018-06-17 DIAGNOSIS — S161XXA Strain of muscle, fascia and tendon at neck level, initial encounter: Secondary | ICD-10-CM

## 2018-06-17 DIAGNOSIS — M7918 Myalgia, other site: Secondary | ICD-10-CM

## 2018-06-17 DIAGNOSIS — S39012A Strain of muscle, fascia and tendon of lower back, initial encounter: Secondary | ICD-10-CM

## 2018-06-17 DIAGNOSIS — J0100 Acute maxillary sinusitis, unspecified: Secondary | ICD-10-CM

## 2018-06-17 MED ORDER — CYCLOBENZAPRINE HCL 5 MG PO TABS: 5.0000 mg | ORAL_TABLET | Freq: Every day | ORAL | 0 refills | Status: DC

## 2018-06-17 MED ORDER — SULFAMETHOXAZOLE-TRIMETHOPRIM 800-160 MG PO TABS: 1.0000 | ORAL_TABLET | Freq: Two times a day (BID) | ORAL | 0 refills | Status: DC
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
## 2018-06-18 ENCOUNTER — Encounter (HOSPITAL_COMMUNITY): Payer: Self-pay | Admitting: Emergency Medicine

## 2018-06-18 ENCOUNTER — Ambulatory Visit (HOSPITAL_COMMUNITY)
Admission: EM | Admit: 2018-06-18 | Discharge: 2018-06-18 | Disposition: A | Discharge status: Home / Self Care | Payer: Self-pay | Attending: Family Medicine | Admitting: Family Medicine

## 2018-06-18 DIAGNOSIS — S1091XA Abrasion of unspecified part of neck, initial encounter: Secondary | ICD-10-CM

## 2018-06-18 DIAGNOSIS — S31821A Laceration without foreign body of left buttock, initial encounter: Secondary | ICD-10-CM

## 2018-06-18 DIAGNOSIS — S21219A Laceration without foreign body of unspecified back wall of thorax without penetration into thoracic cavity, initial encounter: Secondary | ICD-10-CM

## 2018-06-18 DIAGNOSIS — S8012XA Contusion of left lower leg, initial encounter: Secondary | ICD-10-CM

## 2018-06-18 DIAGNOSIS — S8011XA Contusion of right lower leg, initial encounter: Secondary | ICD-10-CM

## 2018-06-18 MED ORDER — HYDROCODONE-ACETAMINOPHEN 5-325 MG PO TABS
1.0000 | ORAL_TABLET | Freq: Once | ORAL | Status: AC
  Administered 2018-06-18: 1 via ORAL

## 2018-06-18 MED ORDER — LIDOCAINE-EPINEPHRINE (PF) 2 %-1:200000 IJ SOLN
INTRAMUSCULAR | Status: AC
  Filled 2018-06-18: qty 20

## 2018-06-18 MED ORDER — IBUPROFEN 800 MG PO TABS: 800.0000 mg | ORAL_TABLET | Freq: Three times a day (TID) | ORAL | 0 refills | Status: DC

## 2018-06-18 MED ORDER — HYDROCODONE-ACETAMINOPHEN 5-325 MG PO TABS
ORAL_TABLET | ORAL | Status: AC
  Filled 2018-06-18: qty 1
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
## 2018-06-18 NOTE — Discharge Instructions (Signed)
Keep clean and dry Watch for infection-redness, pus, increased pain Stitches need to come out in 7 days Return sooner for any problems Take ibuprofen 3 times a day with food as needed for pain
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
## 2019-09-07 ENCOUNTER — Other Ambulatory Visit: Payer: Self-pay

## 2019-09-07 ENCOUNTER — Encounter (HOSPITAL_COMMUNITY): Payer: Self-pay

## 2019-09-07 ENCOUNTER — Ambulatory Visit (HOSPITAL_COMMUNITY)
Admission: EM | Admit: 2019-09-07 | Discharge: 2019-09-07 | Disposition: A | Discharge status: Home / Self Care | Payer: Medicaid Other | Attending: Physician Assistant | Admitting: Physician Assistant

## 2019-09-07 DIAGNOSIS — F17210 Nicotine dependence, cigarettes, uncomplicated: Secondary | ICD-10-CM | POA: Insufficient documentation

## 2019-09-07 DIAGNOSIS — J069 Acute upper respiratory infection, unspecified: Secondary | ICD-10-CM | POA: Insufficient documentation

## 2019-09-07 DIAGNOSIS — Z20828 Contact with and (suspected) exposure to other viral communicable diseases: Secondary | ICD-10-CM | POA: Insufficient documentation

## 2019-09-07 DIAGNOSIS — Z886 Allergy status to analgesic agent status: Secondary | ICD-10-CM | POA: Insufficient documentation

## 2019-09-07 NOTE — ED Provider Notes (Signed)
[facility]    CSN: [id number] Arrival date & time: 09/07/19  1552      History   Chief Complaint Chief Complaint  Patient presents with  . Dizziness  . Blurred Vision  . Headache    HPI Alexandra Ramsey is a 28 y.o. female.   The history is provided by the patient. No language interpreter was used.  Dizziness Quality:  Lightheadedness Severity:  Moderate Onset quality:  Gradual Timing:  Constant Progression:  Worsening Chronicity:  New Relieved by:  Nothing Worsened by:  Nothing Ineffective treatments:  None tried Associated symptoms: headaches   Headache Associated symptoms: dizziness   Pt reports her Aunt had covid.  Pt's Mother wants her to get tested.  Pt reports she felt dizzy and had blurred vision yesterday  Past Medical History:  Diagnosis Date  . Cellulitis and abscess of neck 12/22/2014   right  . History of blood transfusion    "related to ulcerative colitis"  . MRSA (methicillin resistant Staphylococcus aureus)    patient stated  . Ulcerative colitis 05/29/08 & 04/29/10   Colonoscopy with biopsy Dx'd    Patient Active Problem List   Diagnosis Date Noted  . Obesity (BMI 30-39.9) 07/26/2011  . 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 COLONOSCOPY W/ BIOPSIES  05/29/08 and 05/08/10   Biopsy consistent with colitis  . MASS BIOPSY Right 12/23/2014   Procedure: incision and drainage right neck abscess;  Surgeon: Osborn Coho, MD;  Location: [facility] OR;  Service: ENT;  Laterality: Right;  . TONSILLECTOMY AND ADENOIDECTOMY  ~ 2001  . WISDOM TOOTH EXTRACTION      OB History   No obstetric history on file.      Home Medications    Prior to Admission medications   Medication Sig Start Date End Date Taking? Authorizing Provider  acetaminophen (TYLENOL) 500 MG tablet Take 1 tablet (500 mg total) by mouth every 6 (six) hours as needed. 12/15/16   Everlene Farrier, PA-C  cyclobenzaprine (FLEXERIL)  5 MG tablet Take 1 tablet (5 mg total) by mouth at bedtime. 06/17/18   Elvina Sidle, MD  ibuprofen (ADVIL,MOTRIN) 800 MG tablet Take 1 tablet (800 mg total) by mouth 3 (three) times daily. 06/18/18   Eustace Moore, MD  sulfamethoxazole-trimethoprim (BACTRIM DS,SEPTRA DS) 800-160 MG tablet Take 1 tablet by mouth 2 (two) times daily. 06/17/18   Elvina Sidle, MD    Family History Family History  Problem Relation Age of Onset  . Colon polyps Father   . Hypertension Father   . Hypertension Mother   . Colon polyps Maternal Grandmother   . Colon polyps Paternal Grandmother   . Inflammatory bowel disease Neg Hx     Social History Social History   Tobacco Use  . Smoking status: Current Every Day Smoker    Packs/day: 0.50    Years: 7.00    Pack years: 3.50    Types: Cigarettes  . Smokeless tobacco: Never Used  Substance Use Topics  . Alcohol use: Yes    Alcohol/week: 2.0 standard drinks    Types: 2 Cans of beer per week    Comment: socially  . Drug use: No    Frequency: 3.0 times per week    Types: Marijuana    Comment: 12/22/2014 ""tried marijuana; don't smoke it"     Allergies   Ibuprofen   Review of Systems Review of Systems  Neurological: Positive for dizziness and headaches.  All other systems reviewed and are negative.    Physical Exam Triage Vital Signs ED Triage Vitals  Enc Vitals Group     BP 09/07/19 1616 124/77     Pulse Rate 09/07/19 1616 77     Resp 09/07/19 1616 16     Temp 09/07/19 1616 98.1 F (36.7 C)     Temp Source 09/07/19 1616 Oral     SpO2 09/07/19 1616 100 %     Weight --      Height --      Head Circumference --      Peak Flow --      Pain Score 09/07/19 1614 6     Pain Loc --      Pain Edu? --      Excl. in [place]? --    No data found.  Updated Vital Signs BP 124/77 (BP Location: Right Arm)   Pulse 77   Temp 98.1 F (36.7 C) (Oral)   Resp 16   LMP 09/04/2019 (Exact Date)   SpO2 100%   Visual Acuity Right Eye  Distance: 20/30 Left Eye Distance: 20/40 Bilateral Distance: 20/30  Right Eye Near:   Left Eye Near:    Bilateral Near:     Physical Exam Vitals signs and nursing note reviewed.  Constitutional:      Appearance: She is well-developed.  HENT:     Head: Normocephalic.  Neck:     Musculoskeletal: Normal range of motion.  Cardiovascular:     Rate and Rhythm: Normal rate and regular rhythm.  Pulmonary:     Effort: Pulmonary effort is normal.  Abdominal:     General: There is no distension.  Musculoskeletal: Normal range of motion.  Neurological:     Mental Status: She is alert and oriented to person, place, and time.  Psychiatric:        Mood and Affect: Mood normal.      UC Treatments / Results  Labs (all labs ordered are listed, but only abnormal results are displayed) Labs Reviewed  SARS CORONAVIRUS 2 (TAT 6-24 HRS)    EKG   Radiology No results found.  Procedures Procedures (including critical care time)  Medications Ordered in UC Medications - No data to display  Initial Impression / Assessment and Plan / UC Course  I have reviewed the triage vital signs and the nursing notes.  Pertinent labs & imaging results that were available during my care of the patient were reviewed by me and considered in my medical decision making (see chart for details).     MDM  covid test ordered.  Pt counseled on quarantine.  Pt has normal vitals, normal exam.  Final Clinical Impressions(s) / UC Diagnoses   Final diagnoses:  Acute upper respiratory infection   Discharge Instructions   None    ED Prescriptions    None     PDMP not reviewed this encounter.  An After Visit Summary was printed and given to the patient.   Fransico Meadow, [place] 09/07/19 1714

## 2019-09-07 NOTE — ED Triage Notes (Signed)
Pt states she started feeling lightheaded and headcheyesterday at work. Pt states she started having blurred vision yesterday and earlier this morning. Pt states she feels "sluggish" with dry mouth and  mild headache.

## 2019-09-08 LAB — SARS CORONAVIRUS 2 (TAT 6-24 HRS): SARS Coronavirus 2: NEGATIVE

## 2019-12-11 ENCOUNTER — Other Ambulatory Visit: Payer: Self-pay

## 2019-12-11 ENCOUNTER — Ambulatory Visit (HOSPITAL_COMMUNITY)
Admission: EM | Admit: 2019-12-11 | Discharge: 2019-12-11 | Disposition: A | Discharge status: Home / Self Care | Payer: Medicaid Other | Attending: Family Medicine | Admitting: Family Medicine

## 2019-12-11 ENCOUNTER — Encounter (HOSPITAL_COMMUNITY): Payer: Self-pay

## 2019-12-11 DIAGNOSIS — K529 Noninfective gastroenteritis and colitis, unspecified: Secondary | ICD-10-CM

## 2019-12-11 DIAGNOSIS — R197 Diarrhea, unspecified: Secondary | ICD-10-CM

## 2019-12-11 MED ORDER — PREDNISONE 20 MG PO TABS: ORAL_TABLET | ORAL | 0 refills | Status: DC

## 2019-12-11 MED ORDER — DICYCLOMINE HCL 20 MG PO TABS: 20.0000 mg | ORAL_TABLET | Freq: Three times a day (TID) | ORAL | 0 refills | Status: DC

## 2019-12-11 NOTE — ED Provider Notes (Signed)
[place]    CSN: [id number] Arrival date & time: 12/11/19  0849      History   Chief Complaint Chief Complaint  Patient presents with  . Diarrhea  . Abdominal Pain    HPI Alexandra Ramsey is a 29 y.o. female.   HPI  Patient states she has a history of ulcerative colitis.  It has not been active since she was in the 11th grade.  She is now 29 years old.  She states that she has been having diarrhea for about a week.  At first she thought it was just a stomach flu.  Her sister also had diarrhea.  Her sister was better in just 2 days, her diarrhea is persisting and now she has blood in her stool.  No fever or chills.  No mucus or pus in the stool.  No nausea or vomiting.  She is having some abdominal crampy pain with the diarrhea.  She feels like she needs prednisone. She is not under the care of her primary care doctor.  She is not under the care of gastroenterologist.  I told her that this would probably be a good idea for her given her medical history.  Past Medical History:  Diagnosis Date  . Cellulitis and abscess of neck 12/22/2014   right  . History of blood transfusion    "related to ulcerative colitis"  . MRSA (methicillin resistant Staphylococcus aureus)    patient stated  . Ulcerative colitis 05/29/08 & 04/29/10   Colonoscopy with biopsy Dx'd    Patient Active Problem List   Diagnosis Date Noted  . Obesity (BMI 30-39.9) 07/26/2011  . 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 COLONOSCOPY W/ BIOPSIES  05/29/08 and 05/08/10   Biopsy consistent with colitis  . MASS BIOPSY Right 12/23/2014   Procedure: incision and drainage right neck abscess;  Surgeon: Jerrell Belfast, MD;  Location: [facility];  Service: ENT;  Laterality: Right;  . TONSILLECTOMY AND ADENOIDECTOMY  ~ 2001  . WISDOM TOOTH EXTRACTION      OB History   No obstetric history on file.      Home Medications    Prior to Admission medications     Medication Sig Start Date End Date Taking? Authorizing Provider  acetaminophen (TYLENOL) 500 MG tablet Take 1 tablet (500 mg total) by mouth every 6 (six) hours as needed. 12/15/16  Yes Waynetta Pean, PA-C  dicyclomine (BENTYL) 20 MG tablet Take 1 tablet (20 mg total) by mouth 4 (four) times daily -  before meals and at bedtime. 12/11/19   Raylene Everts, MD  predniSONE (DELTASONE) 20 MG tablet Take one tab 3 x a day for 3 days then 2 a day for 3 days then one a day for 3 days 12/11/19   Raylene Everts, MD    Family History Family History  Problem Relation Age of Onset  . Colon polyps Father   . Hypertension Father   . Hypertension Mother   . Colon polyps Maternal Grandmother   . Colon polyps Paternal Grandmother   . Inflammatory bowel disease Neg Hx     Social History Social History   Tobacco Use  . Smoking status: Current Every Day Smoker    Packs/day: 0.50    Years: 7.00    Pack years: 3.50    Types: Cigarettes  . Smokeless tobacco: Never Used  Substance Use Topics  . Alcohol use: Yes  Alcohol/week: 2.0 standard drinks    Types: 2 Cans of beer per week    Comment: socially  . Drug use: Yes    Frequency: 3.0 times per week    Types: Marijuana, Cocaine    Comment: 12/22/2014 ""tried marijuana; don't smoke it"     Allergies   Ibuprofen   Review of Systems Review of Systems  Constitutional: Negative for appetite change, chills, fatigue and fever.  Gastrointestinal: Positive for abdominal pain, blood in stool and diarrhea.     Physical Exam Triage Vital Signs ED Triage Vitals  Enc Vitals Group     BP 12/11/19 0906 119/87     Pulse Rate 12/11/19 0906 87     Resp 12/11/19 0906 18     Temp 12/11/19 0906 97.9 F (36.6 C)     Temp Source 12/11/19 0906 Oral     SpO2 12/11/19 0906 99 %     Weight --      Height --      Head Circumference --      Peak Flow --      Pain Score 12/11/19 0903 8     Pain Loc --      Pain Edu? --      Excl. in [place]? --     No data found.  Updated Vital Signs BP 119/87 (BP Location: Left Arm)   Pulse 87   Temp 97.9 F (36.6 C) (Oral)   Resp 18   LMP 12/03/2019 (Approximate)   SpO2 99%      Physical Exam Constitutional:      General: She is not in acute distress.    Appearance: She is well-developed. She is obese.  HENT:     Head: Normocephalic and atraumatic.  Eyes:     Conjunctiva/sclera: Conjunctivae normal.     Pupils: Pupils are equal, round, and reactive to light.  Cardiovascular:     Rate and Rhythm: Normal rate and regular rhythm.  Pulmonary:     Effort: Pulmonary effort is normal. No respiratory distress.  Abdominal:     General: Abdomen is protuberant. Bowel sounds are normal. There is no distension.     Palpations: Abdomen is soft.     Tenderness: There is generalized abdominal tenderness.     Comments: Mild general tenderness.  No mass or organomegaly.  No guarding or rebound  Musculoskeletal:        General: Normal range of motion.     Cervical back: Normal range of motion.  Skin:    General: Skin is warm and dry.  Neurological:     Mental Status: She is alert.  Psychiatric:        Mood and Affect: Mood normal.        Behavior: Behavior normal.      UC Treatments / Results  Labs (all labs ordered are listed, but only abnormal results are displayed) Labs Reviewed - No data to display  EKG   Radiology No results found.  Procedures Procedures (including critical care time)  Medications Ordered in UC Medications - No data to display  Initial Impression / Assessment and Plan / UC Course  I have reviewed the triage vital signs and the nursing notes.  Pertinent labs & imaging results that were available during my care of the patient were reviewed by me and considered in my medical decision making (see chart for details).     Reviewed with patient that she needs to be under the care of a gastroenterologist for  ulcerative colitis.  She should have periodic  monitoring of this disease. Final Clinical Impressions(s) / UC Diagnoses   Final diagnoses:  Colitis  Diarrhea, unspecified type     Discharge Instructions     You need to be under the care of a gastroenterologist for your colitis See your primary care doctor next week Take the prednisone as directed Take bentyl for the cramping Lots of fluids     ED Prescriptions    Medication Sig Dispense Auth. Provider   predniSONE (DELTASONE) 20 MG tablet Take one tab 3 x a day for 3 days then 2 a day for 3 days then one a day for 3 days 18 tablet Eustace Moore, MD   dicyclomine (BENTYL) 20 MG tablet Take 1 tablet (20 mg total) by mouth 4 (four) times daily -  before meals and at bedtime. 40 tablet Eustace Moore, MD     PDMP not reviewed this encounter.   Eustace Moore, MD 12/11/19 1116

## 2019-12-11 NOTE — ED Triage Notes (Signed)
Pt states h/o ulcerative colitis and feels like she has a "flare up". C/o abd pain, diarrhea with blood in stool x 1 week. Had n/v and chills last weekend, now resolved.  Pt states sister had similar sx a day prior to onset of pt symptoms but w/o blood in stool. Denies fever, cough, sore throat, congestion or body aches.  Denies dysuria sx.

## 2019-12-11 NOTE — Discharge Instructions (Addendum)
You need to be under the care of a gastroenterologist for your colitis See your primary care doctor next week Take the prednisone as directed Take bentyl for the cramping Lots of fluids

## 2019-12-19 ENCOUNTER — Other Ambulatory Visit: Payer: Self-pay

## 2019-12-19 ENCOUNTER — Ambulatory Visit (HOSPITAL_COMMUNITY)
Admission: EM | Admit: 2019-12-19 | Discharge: 2019-12-19 | Disposition: A | Discharge status: Home / Self Care | Payer: Self-pay | Attending: Family Medicine | Admitting: Family Medicine

## 2019-12-19 ENCOUNTER — Encounter (HOSPITAL_COMMUNITY): Payer: Self-pay

## 2019-12-19 DIAGNOSIS — K0889 Other specified disorders of teeth and supporting structures: Secondary | ICD-10-CM

## 2019-12-19 DIAGNOSIS — J0100 Acute maxillary sinusitis, unspecified: Secondary | ICD-10-CM

## 2019-12-19 MED ORDER — AMOXICILLIN-POT CLAVULANATE 875-125 MG PO TABS: 1.0000 | ORAL_TABLET | Freq: Two times a day (BID) | ORAL | 0 refills | Status: AC

## 2019-12-19 MED ORDER — OLOPATADINE HCL 0.1 % OP SOLN: 1.0000 [drp] | Freq: Two times a day (BID) | OPHTHALMIC | 0 refills | Status: DC

## 2019-12-19 MED ORDER — FLUTICASONE PROPIONATE 50 MCG/ACT NA SUSP: 1.0000 | Freq: Every day | NASAL | 0 refills | Status: DC

## 2019-12-19 NOTE — ED Triage Notes (Addendum)
Patient presents to Urgent Care with complaints of dental pain and slight swelling on the left side since 4-5 days ago. Patient reports she thinks she may have an infected tooth.  Patient states that she had a wisdom tooth on the bottom left that was never extracted and so pulled it out herself and she thinks that is where the pain and possible infection is coming from.

## 2019-12-19 NOTE — ED Provider Notes (Signed)
[facility]    CSN: [id number] Arrival date & time: 12/19/19  1111      History   Chief Complaint Chief Complaint  Patient presents with  . Dental Pain    HPI Alexandra Ramsey is a 29 y.o. female history of ulcerative colitis, presenting today for evaluation of facial swelling and dental pain.  Patient states that earlier in the week she began to develop some swelling around her right eye.  She has had some mild discomfort with this.  Has felt similar sensations with prior sinus issues before.  Also reports some watery drainage from eye.  More recently she believes she has developed increased pain to her left upper jaw around the area where she self extracted her wisdom tooth.  States that this was approximately 1 month ago.  She denies any swelling around this area.  Denies any difficulty swallowing or neck pain.  Denies any fevers.  Denies any URI symptoms of congestion cough or sore throat.  HPI  Past Medical History:  Diagnosis Date  . Cellulitis and abscess of neck 12/22/2014   right  . History of blood transfusion    "related to ulcerative colitis"  . MRSA (methicillin resistant Staphylococcus aureus)    patient stated  . Ulcerative colitis 05/29/08 & 04/29/10   Colonoscopy with biopsy Dx'd    Patient Active Problem List   Diagnosis Date Noted  . Obesity (BMI 30-39.9) 07/26/2011  . 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 COLONOSCOPY W/ BIOPSIES  05/29/08 and 05/08/10   Biopsy consistent with colitis  . MASS BIOPSY Right 12/23/2014   Procedure: incision and drainage right neck abscess;  Surgeon: Jerrell Belfast, MD;  Location: [facility];  Service: ENT;  Laterality: Right;  . TONSILLECTOMY AND ADENOIDECTOMY  ~ 2001  . WISDOM TOOTH EXTRACTION      OB History   No obstetric history on file.      Home Medications    Prior to Admission medications   Medication Sig Start Date End Date Taking? Authorizing Provider   predniSONE (DELTASONE) 20 MG tablet Take one tab 3 x a day for 3 days then 2 a day for 3 days then one a day for 3 days 12/11/19  Yes Raylene Everts, MD  acetaminophen (TYLENOL) 500 MG tablet Take 1 tablet (500 mg total) by mouth every 6 (six) hours as needed. 12/15/16   Waynetta Pean, PA-C  amoxicillin-clavulanate (AUGMENTIN) 875-125 MG tablet Take 1 tablet by mouth every 12 (twelve) hours for 7 days. 12/19/19 12/26/19  Irving Lubbers C, PA-C  dicyclomine (BENTYL) 20 MG tablet Take 1 tablet (20 mg total) by mouth 4 (four) times daily -  before meals and at bedtime. 12/11/19   Raylene Everts, MD  fluticasone [facility]) 50 MCG/ACT nasal spray Place 1-2 sprays into both nostrils daily for 7 days. 12/19/19 12/26/19  Mico Spark C, PA-C  olopatadine (PATANOL) 0.1 % ophthalmic solution Place 1 drop into both eyes 2 (two) times daily. 12/19/19   Haelyn Forgey, Elesa Hacker, PA-C    Family History Family History  Problem Relation Age of Onset  . Colon polyps Father   . Hypertension Father   . Hypertension Mother   . Colon polyps Maternal Grandmother   . Colon polyps Paternal Grandmother   . Inflammatory bowel disease Neg Hx     Social History Social History   Tobacco Use  . Smoking status: Current Every Day Smoker  Packs/day: 0.50    Years: 7.00    Pack years: 3.50    Types: Cigarettes  . Smokeless tobacco: Never Used  Substance Use Topics  . Alcohol use: Yes    Alcohol/week: 2.0 standard drinks    Types: 2 Cans of beer per week    Comment: socially  . Drug use: Yes    Frequency: 3.0 times per week    Types: Marijuana, Cocaine    Comment: 12/22/2014 ""tried marijuana; don't smoke it"     Allergies   Ibuprofen   Review of Systems Review of Systems  Constitutional: Negative for activity change, appetite change, chills, fatigue and fever.  HENT: Positive for dental problem and sinus pressure. Negative for congestion, ear pain, rhinorrhea, sore throat and trouble swallowing.     Eyes: Positive for discharge. Negative for photophobia, pain, redness, itching and visual disturbance.  Respiratory: Negative for cough, chest tightness and shortness of breath.   Cardiovascular: Negative for chest pain.  Gastrointestinal: Negative for abdominal pain, diarrhea, nausea and vomiting.  Musculoskeletal: Negative for myalgias.  Skin: Negative for rash.  Neurological: Negative for dizziness, light-headedness and headaches.     Physical Exam Triage Vital Signs ED Triage Vitals  Enc Vitals Group     BP 12/19/19 1125 113/78     Pulse Rate 12/19/19 1125 90     Resp 12/19/19 1125 18     Temp 12/19/19 1125 98.1 F (36.7 C)     Temp Source 12/19/19 1125 Oral     SpO2 12/19/19 1125 99 %     Weight --      Height --      Head Circumference --      Peak Flow --      Pain Score 12/19/19 1135 9     Pain Loc --      Pain Edu? --      Excl. in [place]? --    No data found.  Updated Vital Signs BP 113/78 (BP Location: Left Arm)   Pulse 90   Temp 98.1 F (36.7 C) (Oral)   Resp 18   LMP 12/03/2019 (Approximate)   SpO2 99%   Visual Acuity Right Eye Distance:   Left Eye Distance:   Bilateral Distance:    Right Eye Near:   Left Eye Near:    Bilateral Near:     Physical Exam Vitals and nursing note reviewed.  Constitutional:      Appearance: She is well-developed.     Comments: No acute distress  HENT:     Head: Normocephalic and atraumatic.     Ears:     Comments: Bilateral ears without tenderness to palpation of external auricle, tragus and mastoid, EAC's without erythema or swelling, TM's with good bony landmarks and cone of light. Non erythematous.    Nose: Nose normal.     Comments: Nasal mucosa pink, swollen turbinates bilaterally    Mouth/Throat:     Comments: Gingival erythema tenderness and mild swelling above left upper posterior molars  No soft palate swelling, posterior pharynx patent Eyes:     Conjunctiva/sclera: Conjunctivae normal.     Comments:  Right eye with very mild periorbital swelling without erythema, eye appears slightly watery, no conjunctival erythema, no photophobia with exam  Neck:     Comments: No overlying neck swelling or erythema, full active range of motion of neck Cardiovascular:     Rate and Rhythm: Normal rate.  Pulmonary:     Effort: Pulmonary effort is normal. No  respiratory distress.     Comments: Breathing comfortably at rest, CTABL, no wheezing, rales or other adventitious sounds auscultated Abdominal:     General: There is no distension.  Musculoskeletal:        General: Normal range of motion.     Cervical back: Neck supple.  Skin:    General: Skin is warm and dry.  Neurological:     Mental Status: She is alert and oriented to person, place, and time.      UC Treatments / Results  Labs (all labs ordered are listed, but only abnormal results are displayed) Labs Reviewed - No data to display  EKG   Radiology No results found.  Procedures Procedures (including critical care time)  Medications Ordered in UC Medications - No data to display  Initial Impression / Assessment and Plan / UC Course  I have reviewed the triage vital signs and the nursing notes.  Pertinent labs & imaging results that were available during my care of the patient were reviewed by me and considered in my medical decision making (see chart for details).    Dental pain, possible underlying infection, will treat with Augmentin twice daily x1 week for this.,  No sign of abscess at this time.  Do not suspect contralateral eye symptoms to be related.  No sign of infection within the eye, symptoms less than 1 week, will treat with olopatadine as well as Flonase nasal spray to help with sinus inflammation.  Discussed strict return precautions. Patient verbalized understanding and is agreeable with plan.  Final Clinical Impressions(s) / UC Diagnoses   Final diagnoses:  Pain, dental  Acute non-recurrent maxillary  sinusitis     Discharge Instructions     Begin Augmentin twice daily for the next week with food Flonase nasal spray 1 to 2 spray in each nostril daily Olopatadine twice daily [facility]I Use 1000 mg of Tylenol every 6 hours as needed for pain  Please return if you start to experience significant swelling of your face, experiencing fever, worsening or persistent symptoms   ED Prescriptions    Medication Sig Dispense Auth. Provider   amoxicillin-clavulanate (AUGMENTIN) 875-125 MG tablet Take 1 tablet by mouth every 12 (twelve) hours for 7 days. 14 tablet Martyn Timme C, PA-C   fluticasone (FLONASE) 50 MCG/ACT nasal spray Place 1-2 sprays into both nostrils daily for 7 days. 1 g Lorraine Terriquez C, PA-C   olopatadine (PATANOL) 0.1 % ophthalmic solution Place 1 drop into both eyes 2 (two) times daily. 5 mL Charlestine Rookstool, Marion C, PA-C     I have reviewed the PDMP during this encounter.   Chanse Kagel, [place] C, PA-C 12/19/19 1230

## 2019-12-19 NOTE — Discharge Instructions (Signed)
Begin Augmentin twice daily for the next week with food Flonase nasal spray 1 to 2 spray in each nostril daily Olopatadine twice daily NHI Use 1000 mg of Tylenol every 6 hours as needed for pain  Please return if you start to experience significant swelling of your face, experiencing fever, worsening or persistent symptoms

## 2020-03-20 ENCOUNTER — Other Ambulatory Visit: Payer: Self-pay

## 2020-03-20 ENCOUNTER — Ambulatory Visit (HOSPITAL_COMMUNITY)
Admission: EM | Admit: 2020-03-20 | Discharge: 2020-03-20 | Disposition: A | Discharge status: Home / Self Care | Payer: Medicaid Other | Attending: Physician Assistant | Admitting: Physician Assistant

## 2020-03-20 ENCOUNTER — Encounter (HOSPITAL_COMMUNITY): Payer: Self-pay

## 2020-03-20 DIAGNOSIS — J0141 Acute recurrent pansinusitis: Secondary | ICD-10-CM

## 2020-03-20 MED ORDER — SALINE SPRAY 0.65 % NA SOLN: 1.0000 | NASAL | 0 refills | Status: DC | PRN

## 2020-03-20 MED ORDER — CETIRIZINE HCL 10 MG PO TABS
10.0000 mg | ORAL_TABLET | Freq: Every day | ORAL | 0 refills | Status: DC
Start: 2020-03-20 — End: 2021-03-01

## 2020-03-20 MED ORDER — AMOXICILLIN-POT CLAVULANATE 875-125 MG PO TABS: 1.0000 | ORAL_TABLET | Freq: Two times a day (BID) | ORAL | 0 refills | Status: AC

## 2020-03-20 MED ORDER — IPRATROPIUM BROMIDE 0.06 % NA SOLN: 2.0000 | Freq: Four times a day (QID) | NASAL | 12 refills | Status: DC

## 2020-03-20 NOTE — ED Notes (Signed)
Pt refused COVID test 

## 2020-03-20 NOTE — ED Triage Notes (Signed)
PT c/o nasal congestion, sinus pain and swelling of right eye since yesterday. Pt states she thinks she has polyps in her nose, because she has been dealing with these symptoms for 2 yrs. Pt states she also does get bad sinus infections.

## 2020-03-20 NOTE — ED Provider Notes (Signed)
[facility]    CSN: [id number] Arrival date & time: 03/20/20  1544      History   Chief Complaint Chief Complaint  Patient presents with  . sinus symptoms    HPI Alexandra Ramsey is a 29 y.o. female.   Patient reports for evaluation of sinus symptoms.  She reports severe sinus congestion and inability to breathe through her nose that has been going on for long time up to 2 years.  She reports last night he got significantly worse and she developed some eye swelling.  She reports she cannot remember the day in the last 2 years where she has been able to breathe through her nose.  She reports a runny nose frequently and congestion constantly.  She has tried different medicines.  She endorses constant facial pain.  She reports occasional chills but no fevers or chills recently.  Denies sore throat.  Denies cough.  Denies nausea, vomiting diarrhea.  She reports though last night symptoms got worse she has these flares frequently.  She has had issues with dental infections in the past and has received treatment for suspected dental infections when she had tooth pain.  However she believes this could have been related to her sinuses.     Past Medical History:  Diagnosis Date  . Cellulitis and abscess of neck 12/22/2014   right  . History of blood transfusion    "related to ulcerative colitis"  . MRSA (methicillin resistant Staphylococcus aureus)    patient stated  . Ulcerative colitis 05/29/08 & 04/29/10   Colonoscopy with biopsy Dx'd    Patient Active Problem List   Diagnosis Date Noted  . Obesity (BMI 30-39.9) 07/26/2011  . 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 COLONOSCOPY W/ BIOPSIES  05/29/08 and 05/08/10   Biopsy consistent with colitis  . MASS BIOPSY Right 12/23/2014   Procedure: incision and drainage right neck abscess;  Surgeon: Osborn Coho, MD;  Location: [facility] OR;  Service: ENT;  Laterality: Right;  .  TONSILLECTOMY AND ADENOIDECTOMY  ~ 2001  . WISDOM TOOTH EXTRACTION      OB History   No obstetric history on file.      Home Medications    Prior to Admission medications   Medication Sig Start Date End Date Taking? Authorizing Provider  acetaminophen (TYLENOL) 500 MG tablet Take 1 tablet (500 mg total) by mouth every 6 (six) hours as needed. 12/15/16   Everlene Farrier, PA-C  amoxicillin-clavulanate (AUGMENTIN) 875-125 MG tablet Take 1 tablet by mouth 2 (two) times daily for 10 days. 03/20/20 03/30/20  Donyale Falcon, Veryl Speak, PA-C  cetirizine (ZYRTEC ALLERGY) 10 MG tablet Take 1 tablet (10 mg total) by mouth at bedtime. 03/20/20 04/19/20  Keon Pender, Veryl Speak, PA-C  dicyclomine (BENTYL) 20 MG tablet Take 1 tablet (20 mg total) by mouth 4 (four) times daily -  before meals and at bedtime. 12/11/19   Eustace Moore, MD  fluticasone [facility]) 50 MCG/ACT nasal spray Place 1-2 sprays into both nostrils daily for 7 days. 12/19/19 12/26/19  Wieters, Hallie C, PA-C  ipratropium (ATROVENT) 0.06 % nasal spray Place 2 sprays into both nostrils 4 (four) times daily. 03/20/20   Kenneisha Cochrane, Veryl Speak, PA-C  olopatadine (PATANOL) 0.1 % ophthalmic solution Place 1 drop into both eyes 2 (two) times daily. 12/19/19   Wieters, Hallie C, PA-C  predniSONE (DELTASONE) 20 MG tablet Take one tab 3 x a day for 3 days then  2 a day for 3 days then one a day for 3 days 12/11/19   Raylene Everts, MD  sodium chloride (OCEAN) 0.65 % SOLN nasal spray Place 1 spray into both nostrils as needed for congestion. 03/20/20   Howard Patton, Marguerita Beards, PA-C    Family History Family History  Problem Relation Age of Onset  . Colon polyps Father   . Hypertension Father   . Hypertension Mother   . Colon polyps Maternal Grandmother   . Colon polyps Paternal Grandmother   . Inflammatory bowel disease Neg Hx     Social History Social History   Tobacco Use  . Smoking status: Current Every Day Smoker    Packs/day: 0.50    Years: 7.00    Pack years: 3.50     Types: Cigarettes  . Smokeless tobacco: Never Used  Substance Use Topics  . Alcohol use: Yes    Alcohol/week: 2.0 standard drinks    Types: 2 Cans of beer per week    Comment: socially  . Drug use: Yes    Frequency: 3.0 times per week    Types: Marijuana, Cocaine    Comment: 12/22/2014 ""tried marijuana; don't smoke it"     Allergies   Ibuprofen   Review of Systems Review of Systems   Physical Exam Triage Vital Signs ED Triage Vitals  Enc Vitals Group     BP 03/20/20 1628 115/73     Pulse Rate 03/20/20 1628 77     Resp 03/20/20 1628 18     Temp 03/20/20 1628 98.1 F (36.7 C)     Temp Source 03/20/20 1628 Oral     SpO2 03/20/20 1628 99 %     Weight 03/20/20 1629 245 lb (111.1 kg)     Height 03/20/20 1629 5\' 5"  (1.651 m)     Head Circumference --      Peak Flow --      Pain Score 03/20/20 1629 8     Pain Loc --      Pain Edu? --      Excl. in [place]? --    No data found.  Updated Vital Signs BP 115/73   Pulse 77   Temp 98.1 F (36.7 C) (Oral)   Resp 18   Ht 5\' 5"  (1.651 m)   Wt 245 lb (111.1 kg)   SpO2 99%   BMI 40.77 kg/m   Visual Acuity Right Eye Distance:   Left Eye Distance:   Bilateral Distance:    Right Eye Near:   Left Eye Near:    Bilateral Near:     Physical Exam Vitals and nursing note reviewed.  Constitutional:      General: She is not in acute distress.    Appearance: She is well-developed. She is not ill-appearing.  HENT:     Head: Normocephalic and atraumatic.     Comments: Pansinus tenderness    Nose:     Comments: Turbinates swollen bilaterally to the septum.  Unable to visualize beyond the anterior nares.  Significant discharge.    Mouth/Throat:     Mouth: Mucous membranes are moist.     Pharynx: Oropharynx is clear.  Eyes:     Conjunctiva/sclera: Conjunctivae normal.     Pupils: Pupils are equal, round, and reactive to light.     Comments: Mild puffiness around the eyes bilaterally.  No tenderness or erythema.    Cardiovascular:     Rate and Rhythm: Normal rate and regular rhythm.  Heart sounds: No murmur.  Pulmonary:     Effort: Pulmonary effort is normal. No respiratory distress.     Breath sounds: Normal breath sounds.  Abdominal:     Palpations: Abdomen is soft.     Tenderness: There is no abdominal tenderness.  Musculoskeletal:     Cervical back: Neck supple.  Skin:    General: Skin is warm and dry.  Neurological:     Mental Status: She is alert.      UC Treatments / Results  Labs (all labs ordered are listed, but only abnormal results are displayed) Labs Reviewed - No data to display  EKG   Radiology No results found.  Procedures Procedures (including critical care time)  Medications Ordered in UC Medications - No data to display  Initial Impression / Assessment and Plan / UC Course  I have reviewed the triage vital signs and the nursing notes.  Pertinent labs & imaging results that were available during my care of the patient were reviewed by me and considered in my medical decision making (see chart for details).     #Recurrent sinusitis. Patient is a 29 year old with at a minimum recurrent pansinusitis with possibly acute on chronic sinusitis.  Unable to fully visualize the nares given the amount of swelling, she may have polyp burden causing chronic sinus congestion.  We will treat her with a course of Augmentin, ipratropium nasal spray and started on Zyrtec.  We will have her follow-up with ENT for further evaluation.  Primary care resource given to establish in the area.  Patient verbalized understanding the plan.   Final Clinical Impressions(s) / UC Diagnoses   Final diagnoses:  Acute recurrent pansinusitis     Discharge Instructions     Take the Augmentin twice a day for 10 days Use the ipratropium nasal spray up to 4 times a day Use the nasal saline often throughout the day Take the Zyrtec daily at bedtime  Once you have completed the Augmentin  you may start using Flonase daily.  Call the ENT office to schedule an appointment to discuss your chronic sinus issues  Call the family medicine center to establish with a primary care.      ED Prescriptions    Medication Sig Dispense Auth. Provider   amoxicillin-clavulanate (AUGMENTIN) 875-125 MG tablet Take 1 tablet by mouth 2 (two) times daily for 10 days. 20 tablet Olu[place]wa Kinyon, Veryl Speak, PA-C   ipratropium (ATROVENT) 0.06 % nasal spray Place 2 sprays into both nostrils 4 (four) times daily. 15 mL Ariana Cavenaugh, Veryl Speak, PA-C   cetirizine (ZYRTEC ALLERGY) 10 MG tablet Take 1 tablet (10 mg total) by mouth at bedtime. 30 tablet Zelta Enfield, Veryl Speak, PA-C   sodium chloride (OCEAN) 0.65 % SOLN nasal spray Place 1 spray into both nostrils as needed for congestion. 44 mL Quinlan Vollmer, Veryl Speak, PA-C     PDMP not reviewed this encounter.   Hermelinda Medicus, PA-C 03/20/20 [phone]

## 2020-03-20 NOTE — Discharge Instructions (Signed)
Take the Augmentin twice a day for 10 days Use the ipratropium nasal spray up to 4 times a day Use the nasal saline often throughout the day Take the Zyrtec daily at bedtime  Once you have completed the Augmentin you may start using Flonase daily.  Call the ENT office to schedule an appointment to discuss your chronic sinus issues  Call the family medicine center to establish with a primary care.

## 2020-10-06 ENCOUNTER — Other Ambulatory Visit: Payer: Self-pay

## 2020-10-06 ENCOUNTER — Ambulatory Visit (HOSPITAL_COMMUNITY)
Admission: EM | Admit: 2020-10-06 | Discharge: 2020-10-06 | Disposition: A | Discharge status: Home / Self Care | Payer: Medicaid Other | Attending: Family Medicine | Admitting: Family Medicine

## 2020-10-06 ENCOUNTER — Encounter (HOSPITAL_COMMUNITY): Payer: Self-pay | Admitting: Emergency Medicine

## 2020-10-06 DIAGNOSIS — K1379 Other lesions of oral mucosa: Secondary | ICD-10-CM | POA: Insufficient documentation

## 2020-10-06 NOTE — ED Provider Notes (Signed)
[facility]    CSN: [id number] Arrival date & time: 10/06/20  1545      History   Chief Complaint Chief Complaint  Patient presents with  . Oral Swelling    HPI Alexandra Ramsey is a 29 y.o. female.   Patient has a lesion on the tip of her tongue.  She is concerned that this may be related to sexual activity.  There is some discomfort.  Sexual partner did not inform her of any STDs.  Patient has been looking at oral lesions on [employer] however  HPI  Past Medical History:  Diagnosis Date  . Cellulitis and abscess of neck 12/22/2014   right  . History of blood transfusion    "related to ulcerative colitis"  . MRSA (methicillin resistant Staphylococcus aureus)    patient stated  . Ulcerative colitis 05/29/08 & 04/29/10   Colonoscopy with biopsy Dx'd    Patient Active Problem List   Diagnosis Date Noted  . Obesity (BMI 30-39.9) 07/26/2011  . 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 COLONOSCOPY W/ BIOPSIES  05/29/08 and 05/08/10   Biopsy consistent with colitis  . MASS BIOPSY Right 12/23/2014   Procedure: incision and drainage right neck abscess;  Surgeon: Osborn Coho, MD;  Location: [facility] OR;  Service: ENT;  Laterality: Right;  . TONSILLECTOMY AND ADENOIDECTOMY  ~ 2001  . WISDOM TOOTH EXTRACTION      OB History   No obstetric history on file.      Home Medications    Prior to Admission medications   Medication Sig Start Date End Date Taking? Authorizing Provider  acetaminophen (TYLENOL) 500 MG tablet Take 1 tablet (500 mg total) by mouth every 6 (six) hours as needed. 12/15/16   Everlene Farrier, PA-C  cetirizine (ZYRTEC ALLERGY) 10 MG tablet Take 1 tablet (10 mg total) by mouth at bedtime. 03/20/20 04/19/20  Darr, Gerilyn Pilgrim, PA-C  dicyclomine (BENTYL) 20 MG tablet Take 1 tablet (20 mg total) by mouth 4 (four) times daily -  before meals and at bedtime. 12/11/19   Eustace Moore, MD  fluticasone [facility]) 50  MCG/ACT nasal spray Place 1-2 sprays into both nostrils daily for 7 days. 12/19/19 12/26/19  Wieters, Hallie C, PA-C  ipratropium (ATROVENT) 0.06 % nasal spray Place 2 sprays into both nostrils 4 (four) times daily. 03/20/20   Darr, Gerilyn Pilgrim, PA-C  olopatadine (PATANOL) 0.1 % ophthalmic solution Place 1 drop into both eyes 2 (two) times daily. 12/19/19   Wieters, Hallie C, PA-C  predniSONE (DELTASONE) 20 MG tablet Take one tab 3 x a day for 3 days then 2 a day for 3 days then one a day for 3 days 12/11/19   Eustace Moore, MD  sodium chloride (OCEAN) 0.65 % SOLN nasal spray Place 1 spray into both nostrils as needed for congestion. 03/20/20   Darr, Gerilyn Pilgrim, PA-C    Family History Family History  Problem Relation Age of Onset  . Colon polyps Father   . Hypertension Father   . Hypertension Mother   . Colon polyps Maternal Grandmother   . Colon polyps Paternal Grandmother   . Inflammatory bowel disease Neg Hx     Social History Social History   Tobacco Use  . Smoking status: Current Every Day Smoker    Packs/day: 0.50    Years: 7.00    Pack years: 3.50    Types: Cigarettes  . Smokeless tobacco: Never Used  Vaping Use  .  Vaping Use: Never used  Substance Use Topics  . Alcohol use: Yes    Alcohol/week: 2.0 standard drinks    Types: 2 Cans of beer per week    Comment: socially  . Drug use: Yes    Frequency: 3.0 times per week    Types: Marijuana, Cocaine    Comment: 12/22/2014 ""tried marijuana; don't smoke it"     Allergies   Ibuprofen   Review of Systems Review of Systems  HENT:       Oral lesion on tip of tongue  All other systems reviewed and are negative.    Physical Exam Triage Vital Signs ED Triage Vitals  Enc Vitals Group     BP 10/06/20 1711 128/75     Pulse Rate 10/06/20 1711 90     Resp 10/06/20 1711 16     Temp 10/06/20 1711 98.5 F (36.9 C)     Temp Source 10/06/20 1711 Oral     SpO2 10/06/20 1711 100 %     Weight --      Height --      Head  Circumference --      Peak Flow --      Pain Score 10/06/20 1708 8     Pain Loc --      Pain Edu? --      Excl. in GC? --    No data found.  Updated Vital Signs BP 128/75 (BP Location: Right Arm)   Pulse 90   Temp 98.5 F (36.9 C) (Oral)   Resp 16   LMP 09/21/2020   SpO2 100%   Visual Acuity Right Eye Distance:   Left Eye Distance:   Bilateral Distance:    Right Eye Near:   Left Eye Near:    Bilateral Near:     Physical Exam Vitals and nursing note reviewed.  Constitutional:      Appearance: Normal appearance.  HENT:     Head: Normocephalic.     Mouth/Throat:     Comments: Oral lesion on tongue appears to be a small mucocele. Cardiovascular:     Rate and Rhythm: Normal rate and regular rhythm.  Pulmonary:     Effort: Pulmonary effort is normal.     Breath sounds: Normal breath sounds.  Neurological:     Mental Status: She is alert.      UC Treatments / Results  Labs (all labs ordered are listed, but only abnormal results are displayed) Labs Reviewed  CYTOLOGY, (ORAL, ANAL, URETHRAL) ANCILLARY ONLY    EKG   Radiology No results found.  Procedures Procedures (including critical care time)  Medications Ordered in UC Medications - No data to display  Initial Impression / Assessment and Plan / UC Course  I have reviewed the triage vital signs and the nursing notes.  Pertinent labs & imaging results that were available during my care of the patient were reviewed by me and considered in my medical decision making (see chart for details).     Probable mucocele on tongue.  Will rule out STD per patient request Final Clinical Impressions(s) / UC Diagnoses   Final diagnoses:  None   Discharge Instructions   None    ED Prescriptions    None     PDMP not reviewed this encounter.   Frederica Kuster, MD 10/06/20 [phone]

## 2020-10-06 NOTE — ED Triage Notes (Signed)
Pt presents with bump on tongue xs 2-3 days. States is very painful.

## 2020-10-07 ENCOUNTER — Telehealth (HOSPITAL_COMMUNITY): Payer: Self-pay | Admitting: Family Medicine

## 2020-10-07 NOTE — Telephone Encounter (Signed)
Swab order

## 2020-10-08 LAB — CERVICOVAGINAL ANCILLARY ONLY
Chlamydia: NEGATIVE
Comment: NEGATIVE
Comment: NEGATIVE
Comment: NORMAL
Neisseria Gonorrhea: NEGATIVE
Trichomonas: NEGATIVE

## 2020-11-09 ENCOUNTER — Other Ambulatory Visit: Payer: Self-pay

## 2020-11-09 ENCOUNTER — Encounter (HOSPITAL_COMMUNITY): Payer: Self-pay

## 2020-11-09 ENCOUNTER — Ambulatory Visit (HOSPITAL_COMMUNITY)
Admission: EM | Admit: 2020-11-09 | Discharge: 2020-11-09 | Disposition: A | Discharge status: Home / Self Care | Payer: Medicaid Other | Attending: Family Medicine | Admitting: Family Medicine

## 2020-11-09 DIAGNOSIS — S39012A Strain of muscle, fascia and tendon of lower back, initial encounter: Secondary | ICD-10-CM

## 2020-11-09 MED ORDER — CYCLOBENZAPRINE HCL 5 MG PO TABS: 5.0000 mg | ORAL_TABLET | Freq: Three times a day (TID) | ORAL | 0 refills | Status: DC | PRN

## 2020-11-09 MED ORDER — KETOROLAC TROMETHAMINE 60 MG/2ML IM SOLN
60.0000 mg | Freq: Once | INTRAMUSCULAR | Status: AC
  Administered 2020-11-09: 60 mg via INTRAMUSCULAR

## 2020-11-09 MED ORDER — KETOROLAC TROMETHAMINE 60 MG/2ML IM SOLN
INTRAMUSCULAR | Status: AC
  Filled 2020-11-09: qty 2
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
## 2021-03-01 ENCOUNTER — Emergency Department (HOSPITAL_COMMUNITY): Payer: Self-pay

## 2021-03-01 ENCOUNTER — Emergency Department (HOSPITAL_COMMUNITY)
Admission: EM | Admit: 2021-03-01 | Discharge: 2021-03-01 | Disposition: A | Discharge status: Home / Self Care | Payer: Self-pay | Attending: Emergency Medicine | Admitting: Emergency Medicine

## 2021-03-01 ENCOUNTER — Encounter (HOSPITAL_COMMUNITY): Payer: Self-pay

## 2021-03-01 DIAGNOSIS — F17210 Nicotine dependence, cigarettes, uncomplicated: Secondary | ICD-10-CM | POA: Insufficient documentation

## 2021-03-01 DIAGNOSIS — S0990XA Unspecified injury of head, initial encounter: Secondary | ICD-10-CM

## 2021-03-01 DIAGNOSIS — Z23 Encounter for immunization: Secondary | ICD-10-CM | POA: Insufficient documentation

## 2021-03-01 DIAGNOSIS — S52224A Nondisplaced transverse fracture of shaft of right ulna, initial encounter for closed fracture: Secondary | ICD-10-CM | POA: Insufficient documentation

## 2021-03-01 DIAGNOSIS — S52225A Nondisplaced transverse fracture of shaft of left ulna, initial encounter for closed fracture: Secondary | ICD-10-CM | POA: Insufficient documentation

## 2021-03-01 DIAGNOSIS — S52226A Nondisplaced transverse fracture of shaft of unspecified ulna, initial encounter for closed fracture: Secondary | ICD-10-CM

## 2021-03-01 DIAGNOSIS — S0101XA Laceration without foreign body of scalp, initial encounter: Secondary | ICD-10-CM | POA: Insufficient documentation

## 2021-03-01 MED ORDER — LIDOCAINE-EPINEPHRINE (PF) 2 %-1:200000 IJ SOLN
20.0000 mL | Freq: Once | INTRAMUSCULAR | Status: DC
  Filled 2021-03-01: qty 20

## 2021-03-01 MED ORDER — OXYCODONE-ACETAMINOPHEN 5-325 MG PO TABS: 1.0000 | ORAL_TABLET | Freq: Four times a day (QID) | ORAL | 0 refills | Status: DC | PRN

## 2021-03-01 MED ORDER — OXYCODONE-ACETAMINOPHEN 5-325 MG PO TABS
1.0000 | ORAL_TABLET | Freq: Once | ORAL | Status: AC
  Administered 2021-03-01: 1 via ORAL
  Filled 2021-03-01: qty 1

## 2021-03-01 MED ORDER — TETANUS-DIPHTH-ACELL PERTUSSIS 5-2.5-18.5 LF-MCG/0.5 IM SUSY
0.5000 mL | PREFILLED_SYRINGE | Freq: Once | INTRAMUSCULAR | Status: AC
  Administered 2021-03-01: 0.5 mL via INTRAMUSCULAR
  Filled 2021-03-01: qty 0.5
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
## 2021-03-01 NOTE — Discharge Instructions (Addendum)
Follow up for staple removal in 1 week. Followup with Dr. Susa Simmonds regarding your ulnar fractures. Return to the ED if you develop new or worsening symptoms .,

## 2021-03-01 NOTE — ED Notes (Signed)
Patient transported to CT 

## 2021-03-01 NOTE — ED Notes (Signed)
Patient back from CT.

## 2021-03-01 NOTE — Progress Notes (Signed)
Orthopedic Tech Progress Note Patient Details:  KANYLAH MUENCH 08/22/1991 [id number] I applied splint NIDONNA wrapped Ortho Devices Type of Ortho Device: Sugartong splint,Arm sling Ortho Device/Splint Location: BUE Ortho Device/Splint Interventions: Ordered,Application,Adjustment   Post Interventions Patient Tolerated: Well Instructions Provided: Care of device   Donald Pore 03/01/2021, 8:04 AM

## 2021-03-01 NOTE — ED Notes (Signed)
Ortho tech placing short splints on arms.
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
## 2021-03-11 ENCOUNTER — Other Ambulatory Visit: Payer: Self-pay

## 2021-03-11 ENCOUNTER — Encounter (HOSPITAL_BASED_OUTPATIENT_CLINIC_OR_DEPARTMENT_OTHER): Payer: Self-pay

## 2021-03-11 ENCOUNTER — Emergency Department (HOSPITAL_BASED_OUTPATIENT_CLINIC_OR_DEPARTMENT_OTHER)
Admission: EM | Admit: 2021-03-11 | Discharge: 2021-03-11 | Disposition: A | Discharge status: Home / Self Care | Payer: Medicaid Other | Attending: Emergency Medicine | Admitting: Emergency Medicine

## 2021-03-11 DIAGNOSIS — X58XXXD Exposure to other specified factors, subsequent encounter: Secondary | ICD-10-CM | POA: Insufficient documentation

## 2021-03-11 DIAGNOSIS — S52602D Unspecified fracture of lower end of left ulna, subsequent encounter for closed fracture with routine healing: Secondary | ICD-10-CM | POA: Insufficient documentation

## 2021-03-11 DIAGNOSIS — S52501D Unspecified fracture of the lower end of right radius, subsequent encounter for closed fracture with routine healing: Secondary | ICD-10-CM | POA: Insufficient documentation

## 2021-03-11 DIAGNOSIS — S0101XD Laceration without foreign body of scalp, subsequent encounter: Secondary | ICD-10-CM | POA: Insufficient documentation

## 2021-03-11 DIAGNOSIS — F17210 Nicotine dependence, cigarettes, uncomplicated: Secondary | ICD-10-CM | POA: Insufficient documentation

## 2021-03-11 DIAGNOSIS — Z4802 Encounter for removal of sutures: Secondary | ICD-10-CM | POA: Insufficient documentation

## 2021-03-11 NOTE — ED Notes (Signed)
Splint applied by this RN and Theador Hawthorne, RN. Patient assessed for proper circulation and comfort. MD Molpus assessed patient post-splint application and approved patient as appropriate for discharge.

## 2021-03-11 NOTE — ED Notes (Signed)
This RN presented the AVS utilizing Teachback Method. Patient verbalizes understanding of Discharge Instructions. Opportunity for Questioning and Answers were provided. Patient Discharged from ED ambulatory to home.   
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
## 2021-03-11 NOTE — ED Triage Notes (Signed)
Patient here POV from Home for Staple Removal.  Patient has 6 staples in Posterior Head that are due for Removal.   NAD; GCS 15; Ambulatory.

## 2021-03-19 ENCOUNTER — Ambulatory Visit (HOSPITAL_COMMUNITY): Payer: Self-pay

## 2021-05-17 ENCOUNTER — Other Ambulatory Visit: Payer: Self-pay

## 2021-05-17 ENCOUNTER — Ambulatory Visit (HOSPITAL_COMMUNITY): Admission: EM | Admit: 2021-05-17 | Discharge: 2021-05-17 | Discharge status: Against Medical Advice | Payer: Self-pay

## 2021-05-17 NOTE — ED Notes (Signed)
Called pt 3 x in lobby, no response.

## 2021-05-17 NOTE — ED Triage Notes (Signed)
Xs 2 attempt to call pt in lobby with no answer.

## 2023-01-09 ENCOUNTER — Ambulatory Visit (HOSPITAL_COMMUNITY)
Admission: EM | Admit: 2023-01-09 | Discharge: 2023-01-09 | Disposition: A | Discharge status: Home / Self Care | Payer: Medicaid Other | Attending: Psychiatry | Admitting: Psychiatry

## 2023-01-09 DIAGNOSIS — R4689 Other symptoms and signs involving appearance and behavior: Secondary | ICD-10-CM

## 2023-01-09 DIAGNOSIS — R440 Auditory hallucinations: Secondary | ICD-10-CM | POA: Insufficient documentation

## 2023-01-09 DIAGNOSIS — F1990 Other psychoactive substance use, unspecified, uncomplicated: Secondary | ICD-10-CM

## 2023-01-09 DIAGNOSIS — F1910 Other psychoactive substance abuse, uncomplicated: Secondary | ICD-10-CM | POA: Insufficient documentation

## 2023-01-09 NOTE — ED Provider Notes (Signed)
Behavioral Health Urgent Care Medical Screening Exam  Patient Name: Alexandra Ramsey MRN: [id number] Date of Evaluation: 01/09/23 Chief Complaint: "my family wanted me to come" Diagnosis:  Final diagnoses:  Substance use  Behavior concern   History of Present illness: Alexandra Ramsey is a 32 y.o. female. Pt presents voluntarily to [facility] behavioral health for walk-in assessment. Pt is assessed face-to-face by nurse practitioner.   Teofilo Pod, 31 y.o., female patient seen face to face by this provider; and chart reviewed on 01/09/23.  On evaluation, when asked reason for presenting today, Alexandra Ramsey reports "my family wanted me to come". She reports for the past 2 weeks she has been experiencing auditory hallucinations of a "young kid screaming and crying for help". She reports episodes last about 2 hours. She states she has experienced auditory hallucinations twice a week for the last 2 weeks. She denies visual hallucinations. She denies paranoia. She denies suicidal, homicidal or violent ideations.   Pt reports depressed mood. She endorses poor sleep last night, didn't sleep due to overthinking. She reports typically sleeping 8 hours/night.  Pt denies history of non suicidal self injurious behavior, suicide attempt or inpatient psychiatric hospitalization.  Pt endorses use of thc daily for the past 8 years. She reports last use was today. Pt endorses use of ecstasy twice/week, last use was 3 weeks ago. She denies use of alcohol, crack/cocaine, opioids, methamphetamines, other substances.  Pt denies being connected with counseling or medication management.  She endorses history of ulcerative colitis.  She denies psychiatric history.   She denies knowledge of family psychiatric or medical history.  Pt reports she is living with her mother. She gives verbal consent to speak with her mother [name] and provides phone number [phone].  Pt reports she is unemployed.  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  Pt reports highest level of education is high school diploma.  Pt denies access to a firearm or other weapon.  Spoke with pt's mother, [name], [phone]. She reports pt has not been sleeping well. This morning at 5AM pt called the police because she was concerned for Alexandra Ramsey's safety. Alexandra Ramsey states pt told police that [name] was in danger. Alexandra Ramsey denies that she is in danger. States she is at the hair salon getting her hair done. Discussed filing IVC if there are any safety concerns. Alexandra Ramsey verbalized understanding.  When asked about conversation with [name], pt reports she is concerned about her mother's safety due to concerns about her mother's girlfriend. When asked further about this, pt does not want to talk. Discussed admission to overnight observation. Pt declined admission to overnight observation. She states she will attend open access hours tomorrow for counseling and medication management.   [place] ED from 01/09/2023 in [facility] ED from 03/11/2021 in [facility] Emergency Department at [facility] ED from 03/01/2021 in [facility] Emergency Department at [place] No Risk No Risk No Risk       Psychiatric Specialty Exam  Presentation  General Appearance:Appropriate for Environment; Casual; Fairly Groomed  Eye Contact:Fair  Speech:Clear and Coherent; Normal Rate  Speech Volume:Normal  Handedness:Right   Mood and Affect  Mood: Depressed  Affect: Blunt   Thought Process  Thought Processes: Coherent  Descriptions of Associations:Circumstantial  Orientation:Full (Time, Place and Person)  Thought Content:Logical    Hallucinations:None  Ideas of Reference:None  Suicidal Thoughts:No  Homicidal Thoughts:No   Sensorium  Memory: Immediate  Fair  Judgment: Intact  Insight: Present   Executive Functions  Concentration: Fair  Attention Span: Fair  Recall: [place] of Knowledge: Good  Language: Good   Psychomotor Activity  Psychomotor Activity: Normal   Assets  Assets: Communication Skills; Desire for Improvement; Financial Resources/Insurance; Housing; Physical Health; Resilience; Social Support; Transportation   Sleep  Sleep: Poor  Number of hours: No data recorded  Physical Exam: Physical Exam Constitutional:      General: She is not in acute distress.    Appearance: She is not ill-appearing, toxic-appearing or diaphoretic.  Eyes:     General: No scleral icterus. Cardiovascular:     Rate and Rhythm: Normal rate.  Pulmonary:     Effort: Pulmonary effort is normal. No respiratory distress.  Neurological:     Mental Status: She is alert and oriented to person, place, and time.  Psychiatric:        Attention and Perception: Attention and perception normal.        Mood and Affect: Mood is depressed. Affect is blunt.        Speech: Speech normal.        Behavior: Behavior normal. Behavior is cooperative.        Thought Content: Thought content normal.        Cognition and Memory: Cognition and memory normal.    Review of Systems  Constitutional:  Negative for chills and fever.  Respiratory:  Negative for shortness of breath.   Cardiovascular:  Negative for chest pain and palpitations.  Gastrointestinal:  Negative for abdominal pain.  Neurological:  Negative for headaches.  Psychiatric/Behavioral:  Positive for depression and substance abuse.    Blood pressure 121/81, pulse 95, temperature 98.1 F (36.7 C), temperature source Oral, resp. rate 18, SpO2 100 %. There is no height or weight on file to calculate BMI.  Musculoskeletal: Strength & Muscle Tone: within normal limits Gait & Station: normal Patient leans: N/A  Toast MSE Discharge Disposition for Follow up and  Recommendations: Based on my evaluation the patient does not appear to have an emergency medical condition and can be discharged with resources and follow up care in outpatient services for Medication Management and Individual Therapy  Tharon Aquas, NP 01/09/2023, 1:52 PM
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
## 2023-01-09 NOTE — Discharge Instructions (Signed)
Please come to [facility] (this facility, SECOND FLOOR) during walk in hours for appointment with psychiatrist/provider for further medication management and for therapists for therapy.   Walk in hours for therapy/counseling: Monday through Thursday 7:30AM until slots are full. Every Friday 12PM until slots are full.  Walk in hours for psychiatry/medication management: Monday through Friday 7:30AM until slots are full.   When you arrive please take the elevators upstairs. If you are unsure of where to go, inform the front desk that you are here for open access hours and they will assist you with directions upstairs.  Walk ins spots are limited and are seen first come, first served. YOU MAY NOT BE SEEN THE SAME DAY YOU ARRIVE. To increase the likelihood of being seen the same day, please arrive early, such as by 7:15AM.  Address:  [address], in [place], [address] Ph: [phone]   

## 2023-01-10 ENCOUNTER — Encounter (HOSPITAL_COMMUNITY): Payer: Self-pay

## 2023-01-10 ENCOUNTER — Emergency Department (HOSPITAL_COMMUNITY)
Admission: EM | Admit: 2023-01-10 | Discharge: 2023-01-12 | Disposition: A | Discharge status: Home / Self Care | Payer: Medicaid Other | Attending: Emergency Medicine | Admitting: Emergency Medicine

## 2023-01-10 ENCOUNTER — Ambulatory Visit (HOSPITAL_COMMUNITY)
Admission: EM | Admit: 2023-01-10 | Discharge: 2023-01-10 | Disposition: A | Discharge status: Home / Self Care | Payer: Self-pay

## 2023-01-10 ENCOUNTER — Other Ambulatory Visit: Payer: Self-pay

## 2023-01-10 DIAGNOSIS — D72829 Elevated white blood cell count, unspecified: Secondary | ICD-10-CM | POA: Insufficient documentation

## 2023-01-10 DIAGNOSIS — F600 Paranoid personality disorder: Secondary | ICD-10-CM | POA: Diagnosis not present

## 2023-01-10 DIAGNOSIS — F1910 Other psychoactive substance abuse, uncomplicated: Secondary | ICD-10-CM | POA: Insufficient documentation

## 2023-01-10 DIAGNOSIS — F1990 Other psychoactive substance use, unspecified, uncomplicated: Secondary | ICD-10-CM | POA: Insufficient documentation

## 2023-01-10 DIAGNOSIS — F17210 Nicotine dependence, cigarettes, uncomplicated: Secondary | ICD-10-CM | POA: Insufficient documentation

## 2023-01-10 DIAGNOSIS — Z008 Encounter for other general examination: Secondary | ICD-10-CM

## 2023-01-10 DIAGNOSIS — R4689 Other symptoms and signs involving appearance and behavior: Secondary | ICD-10-CM | POA: Diagnosis present

## 2023-01-10 DIAGNOSIS — Z046 Encounter for general psychiatric examination, requested by authority: Secondary | ICD-10-CM

## 2023-01-10 DIAGNOSIS — F22 Delusional disorders: Secondary | ICD-10-CM | POA: Insufficient documentation

## 2023-01-10 LAB — CBC
HCT: 37.2 % (ref 36.0–46.0)
Hemoglobin: 12 g/dL (ref 12.0–15.0)
MCH: 27.5 pg (ref 26.0–34.0)
MCHC: 32.3 g/dL (ref 30.0–36.0)
MCV: 85.3 fL (ref 80.0–100.0)
Platelets: 268 10*3/uL (ref 150–400)
RBC: 4.36 MIL/uL (ref 3.87–5.11)
RDW: 14.3 % (ref 11.5–15.5)
WBC: 11 10*3/uL — ABNORMAL HIGH (ref 4.0–10.5)
nRBC: 0 % (ref 0.0–0.2)

## 2023-01-10 LAB — RAPID URINE DRUG SCREEN, HOSP PERFORMED
Amphetamines: POSITIVE — AB
Barbiturates: NOT DETECTED
Benzodiazepines: NOT DETECTED
Cocaine: NOT DETECTED
Opiates: NOT DETECTED
Tetrahydrocannabinol: POSITIVE — AB

## 2023-01-10 LAB — COMPREHENSIVE METABOLIC PANEL
ALT: 14 U/L (ref 0–44)
AST: 19 U/L (ref 15–41)
Albumin: 4.1 g/dL (ref 3.5–5.0)
Alkaline Phosphatase: 69 U/L (ref 38–126)
Anion gap: 12 (ref 5–15)
BUN: 5 mg/dL — ABNORMAL LOW (ref 6–20)
CO2: 20 mmol/L — ABNORMAL LOW (ref 22–32)
Calcium: 8.9 mg/dL (ref 8.9–10.3)
Chloride: 101 mmol/L (ref 98–111)
Creatinine, Ser: 0.77 mg/dL (ref 0.44–1.00)
GFR, Estimated: >60 mL/min (ref >60)
Glucose, Bld: 97 mg/dL (ref 70–99)
Potassium: 3.5 mmol/L (ref 3.5–5.1)
Sodium: 133 mmol/L — ABNORMAL LOW (ref 135–145)
Total Bilirubin: 0.9 mg/dL (ref 0.3–1.2)
Total Protein: 7.3 g/dL (ref 6.5–8.1)

## 2023-01-10 LAB — ETHANOL: Alcohol, Ethyl (B): <10 mg/dL (ref <10)

## 2023-01-10 LAB — I-STAT BETA HCG BLOOD, ED (MC, WL, AP ONLY): I-stat hCG, quantitative: <5 m[IU]/mL (ref <5)

## 2023-01-10 LAB — SALICYLATE LEVEL: Salicylate Lvl: <7 mg/dL — ABNORMAL LOW (ref 7.0–30.0)

## 2023-01-10 LAB — ACETAMINOPHEN LEVEL: Acetaminophen (Tylenol), Serum: <10 ug/mL — ABNORMAL LOW (ref 10–30)

## 2023-01-10 MED ORDER — LORAZEPAM 2 MG/ML IJ SOLN
2.0000 mg | Freq: Every day | INTRAMUSCULAR | Status: DC | PRN
  Administered 2023-01-10: 2 mg via INTRAMUSCULAR
  Filled 2023-01-10: qty 1

## 2023-01-10 NOTE — ED Notes (Signed)
Patient not in restraints at this time

## 2023-01-10 NOTE — ED Notes (Signed)
Sitter and this RN attempted to get vital signs. Patient refused stating to just leave her alone and let her sleep.

## 2023-01-10 NOTE — ED Notes (Signed)
Patient getting aggressive stating she isnt taking any meds as she didn't sign or consent to that.

## 2023-01-10 NOTE — ED Notes (Signed)
IVC'd patient brought in by [employer], first exam completed; one set of 1st exam and IVC docs in magistrate file, one copy in medical records file, 3 copies to clipboard located in [place], copy faxed to [facility] and [place]. IVC'd 01/10/23, exp. 01/17/23.

## 2023-01-10 NOTE — ED Notes (Signed)
Patient is refusing to dress out all the way, still has under shirt on and all her jewelry and is refusing to remove another.  Patient stating she is not going to tal to psych as there is nothing wrong with her and she is being held against her will and rights are being violated.  Keeps mentioning getting a Chief Executive Officer.  Patient wanted to speak to her mother to confirm she signed the IVC papers and RN let patient call mother on speaker and mother verified she signed them and told patient she did it bc she doesn't feel patient is acting right and is paranoid and using drugs.  Xray came to get patient, patient refused to get xray of ankle she is complaining of pain with . PA notified and Charge RN of patients noncompliance and PD is to stay at bedside until patient complies and is cooperative.
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
## 2023-01-10 NOTE — ED Notes (Signed)
Patient has been wanded by security.  Patient still remains to have all yellow jewelry on white t shirt and bra.  Patient has been searched by PD as well.  All belongings in locker 5 and valuables in security.

## 2023-01-10 NOTE — ED Notes (Signed)
Patient not in soft restraints at this time.

## 2023-01-10 NOTE — ED Notes (Signed)
Patient asleep. Not in restraints at this time.

## 2023-01-10 NOTE — ED Notes (Signed)
Pt. transferred to purple. IVC paperwork attached to clipboard in purple zone.
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
## 2023-01-11 DIAGNOSIS — F22 Delusional disorders: Secondary | ICD-10-CM

## 2023-01-11 DIAGNOSIS — F1990 Other psychoactive substance use, unspecified, uncomplicated: Secondary | ICD-10-CM | POA: Insufficient documentation

## 2023-01-11 MED ORDER — LORAZEPAM 1 MG PO TABS
1.0000 mg | ORAL_TABLET | Freq: Once | ORAL | Status: AC | PRN
  Administered 2023-01-11: 1 mg via ORAL
  Filled 2023-01-11: qty 1

## 2023-01-11 NOTE — Consult Note (Signed)
[facility] Consult   Reason for Consult: Patient under IVC Referring Physician:   Azucena Cecil, PA-C  Patient Identification: Alexandra Ramsey MRN:  [id number] Principal Diagnosis: Paranoia ([place]) Diagnosis:  Principal Problem:   Paranoia ([place]) Active Problems:   Substance use   Total Time spent with patient: 45 minutes  Subjective:   Alexandra Ramsey is a 32 y.o. female patient admitted to [facility] on IVC due to threatening to harm herself.  HPI: Alexandra Ramsey is a 32 year old female patient who was brought to [facility] on IVC due to substance use, paranoia, wandering away to unknown destination, falling asleep at random places, and threatening to harm herself.  Per IVC by mother, Alexandra Ramsey, Respondent's mental health diagnosis unknown. Respondent stated she wanted to kill herself. She has recently taking recreational drug and it has caused her to be very paranoid. She is unable to contract for safety. She wanders off and doesn't know where and how she got there. Respondent falls asleep at random places.  Patient was seen face to face by this provider, consulted with Dr Dwyane Dee and chart reviewed.   Per chart review, patient presented to [facility] on 01/09/2023 after family was concerned about her mental state. She complained of having auditory hallucination, hearing a young kid screaming, she also reported using THC and ecstasy. Patient was discharged home with resources. Patient returned back to [facility] 01/09/22 on IVC due to paranoia, wandering away to unknown and unsafe place, and threatening to harm herself.  On evaluation today, patient appears sleepy but responds to assessment questions. Patient was looking around the room during assessment, appears to be paranoid. Patient was asked why she was looking around, patient stated "I don't know". Patient is alert and oriented x3, speech is clear and coherent. Patient's eye contact is fair, mood is depressed, affect is  congruent. Patient's thought process is coherent and thought content is logical. Patient denies SI, HI, denies AVH. Patient does not appear to be responding to internal stimuli.   Patient reported that her family wants her to come for mental health evaluation. She denies prior psych history or admission into mental health facility. Patient denies having psychiatric or therapist. Patient reported using THC daily and last time she used was yesterday. Patient also reported using ecstasy twice a week and last time she used was about 2 weeks ago.  Patient reported drinking occasionally. Patient reported that she hear voices sometimes which has been going on for the past 2 weeks. Patient reported that she lives with her mother and she gave consent for provider to call her.   Additional information was obtained from mother Alexandra [id number] [phone]. Mother also gave this provider patient's father's phone number- [phone]. Mother says she will be going for surgery on Monday and would like to give a backup number in case she was not able to be reached.  Mother reported that patient is unsafe and she has to get her on IVC. Mother said patient has been paranoid, saying that people are out there trying to harm her and her mother. Mother says she is threatening to harm herself, and going to unknown places and sleeping off. Mother said this has been going on for a year and this is the worst she has seen her. Mother says she does not sleep, and she hears voices sometimes. Mother reported that patient told her early this week that she took a pill but she does not know what it is called. Mother reported that  patient has colitis but no hx of psych diagnoses or hx of psych admission.  Mother says patient is not safe and in danger to herself.   Support, encouragement and reassurance provided about ongoing stressors and patient provided with opportunity for questions.     Patient meets criteria for inpatient  psychiatric admission.  Past Psychiatric History: None  Risk to Self: Yes Risk to Others: No Prior Inpatient Therapy: No Prior Outpatient Therapy: No  Past Medical History:  Past Medical History:  Diagnosis Date   Cellulitis and abscess of neck 12/22/2014   right   History of blood transfusion    "related to ulcerative colitis"   MRSA (methicillin resistant Staphylococcus aureus)    patient stated   Ulcerative colitis 05/29/08 & 04/29/10   Colonoscopy with biopsy Dx'd    Past Surgical History:  Procedure Laterality Date   COLONOSCOPY W/ BIOPSIES  05/29/08 and 05/08/10   Biopsy consistent with colitis   MASS BIOPSY Right 12/23/2014   Procedure: incision and drainage right neck abscess;  Surgeon: Jerrell Belfast, MD;  Location: [facility] OR;  Service: ENT;  Laterality: Right;   TONSILLECTOMY AND ADENOIDECTOMY  ~ 2001   WISDOM TOOTH EXTRACTION     Family History:  Family History  Problem Relation Age of Onset   Colon polyps Father    Hypertension Father    Hypertension Mother    Colon polyps Maternal Grandmother    Colon polyps Paternal Grandmother    Inflammatory bowel disease Neg Hx    Family Psychiatric  History: Not provided Social History:  Social History   Substance and Sexual Activity  Alcohol Use Yes   Alcohol/week: 2.0 standard drinks of alcohol   Types: 2 Cans of beer per week   Comment: socially     Social History   Substance and Sexual Activity  Drug Use Yes   Frequency: 3.0 times per week   Types: Marijuana, Cocaine   Comment: ecstasy    Social History   Socioeconomic History   Marital status: Single    Spouse name: Not on file   Number of children: Not on file   Years of education: Not on file   Highest education level: Not on file  Occupational History   Not on file  Tobacco Use   Smoking status: Every Day    Packs/day: 0.50    Years: 7.00    Additional pack years: 0.00    Total pack years: 3.50    Types: Cigarettes   Smokeless tobacco: Never   Vaping Use   Vaping Use: Never used  Substance and Sexual Activity   Alcohol use: Yes    Alcohol/week: 2.0 standard drinks of alcohol    Types: 2 Cans of beer per week    Comment: socially   Drug use: Yes    Frequency: 3.0 times per week    Types: Marijuana, Cocaine    Comment: ecstasy   Sexual activity: Never  Other Topics Concern   Not on file  Social History Narrative   Freshman [employer] this fall   Social Determinants of Health   Financial Resource Strain: Not on file  Food Insecurity: Not on file  Transportation Needs: Not on file  Physical Activity: Not on file  Stress: Not on file  Social Connections: Not on file   Additional Social History:    Allergies:   Allergies  Allergen Reactions   Ibuprofen Other (See Comments)    Can't take due to UC    Labs:  Results for orders placed or performed during the hospital encounter of 01/10/23 (from the past 48 hour(s))  Rapid urine drug screen (hospital performed)     Status: Abnormal   Collection Time: 01/10/23  5:21 PM  Result Value Ref Range   Opiates NONE DETECTED NONE DETECTED   Cocaine NONE DETECTED NONE DETECTED   Benzodiazepines NONE DETECTED NONE DETECTED   Amphetamines POSITIVE (A) NONE DETECTED   Tetrahydrocannabinol POSITIVE (A) NONE DETECTED   Barbiturates NONE DETECTED NONE DETECTED    Comment: (NOTE) DRUG SCREEN FOR MEDICAL PURPOSES ONLY.  IF CONFIRMATION IS NEEDED FOR ANY PURPOSE, NOTIFY LAB WITHIN 5 DAYS.  LOWEST DETECTABLE LIMITS FOR URINE DRUG SCREEN Drug Class                     Cutoff (ng/mL) Amphetamine and metabolites    1000 Barbiturate and metabolites    200 Benzodiazepine                 200 Opiates and metabolites        300 Cocaine and metabolites        300 THC                            50 Performed at [facility], [place] [address] [place], [place] [address]   Comprehensive metabolic panel     Status: Abnormal   Collection Time: 01/10/23  5:36 PM  Result Value Ref Range    Sodium 133 (L) 135 - 145 mmol/L   Potassium 3.5 3.5 - 5.1 mmol/L   Chloride 101 98 - 111 mmol/L   CO2 20 (L) 22 - 32 mmol/L   Glucose, Bld 97 70 - 99 mg/dL    Comment: Glucose reference range applies only to samples taken after fasting for at least 8 hours.   BUN 5 (L) 6 - 20 mg/dL   Creatinine, Ser 0.77 0.44 - 1.00 mg/dL   Calcium 8.9 8.9 - 10.3 mg/dL   Total Protein 7.3 6.5 - 8.1 g/dL   Albumin 4.1 3.5 - 5.0 g/dL   AST 19 15 - 41 U/L   ALT 14 0 - 44 U/L   Alkaline Phosphatase 69 38 - 126 U/L   Total Bilirubin 0.9 0.3 - 1.2 mg/dL   GFR, Estimated >60 >60 mL/min    Comment: (NOTE) Calculated using the CKD-EPI Creatinine Equation (2021)    Anion gap 12 5 - 15    Comment: Performed at [facility] [address], [place], [place] [address]  Ethanol     Status: None   Collection Time: 01/10/23  5:36 PM  Result Value Ref Range   Alcohol, Ethyl (B) <10 <10 mg/dL    Comment: (NOTE) Lowest detectable limit for serum alcohol is 10 mg/dL.  For medical purposes only. Performed at [facility], [place] [address] [place], [place] [id number]   Salicylate level     Status: Abnormal   Collection Time: 01/10/23  5:36 PM  Result Value Ref Range   Salicylate Lvl Q000111Q (L) 7.0 - 30.0 mg/dL    Comment: Performed at [facility] [address], [place], [place] [address]  Acetaminophen level     Status: Abnormal   Collection Time: 01/10/23  5:36 PM  Result Value Ref Range   Acetaminophen (Tylenol), Serum <10 (L) 10 - 30 ug/mL    Comment: (NOTE) Therapeutic concentrations vary significantly. A  range of 10-30 ug/mL  may be an effective concentration for many patients. However, some  are best treated at concentrations outside of this range. Acetaminophen concentrations >150 ug/mL at 4 hours after ingestion  and >50 ug/mL at 12 hours after ingestion are often associated with  toxic reactions.  Performed at [facility], [place] [address] [place], [place] [address]    cbc     Status: Abnormal   Collection Time: 01/10/23  5:36 PM  Result Value Ref Range   WBC 11.0 (H) 4.0 - 10.5 K/uL   RBC 4.36 3.87 - 5.11 MIL/uL   Hemoglobin 12.0 12.0 - 15.0 g/dL   HCT 37.2 36.0 - 46.0 %   MCV 85.3 80.0 - 100.0 fL   MCH 27.5 26.0 - 34.0 pg   MCHC 32.3 30.0 - 36.0 g/dL   RDW 14.3 11.5 - 15.5 %   Platelets 268 150 - 400 K/uL   nRBC 0.0 0.0 - 0.2 %    Comment: Performed at [facility], [place] [address] [place], [place] [address]  I-Stat beta hCG blood, ED     Status: None   Collection Time: 01/10/23  5:48 PM  Result Value Ref Range   I-stat hCG, quantitative <5.0 <5 mIU/mL   Comment 3            Comment:   GEST. AGE      CONC.  (mIU/mL)   <=1 WEEK        5 - 50     2 WEEKS       50 - 500     3 WEEKS       100 - 10,000     4 WEEKS     1,000 - 30,000        FEMALE AND NON-PREGNANT FEMALE:     LESS THAN 5 mIU/mL     Current Facility-Administered Medications  Medication Dose Route Frequency Provider Last Rate Last Admin   LORazepam (ATIVAN) injection 2 mg  2 mg Intramuscular Daily PRN Azucena Cecil, PA-C   2 mg at 01/10/23 1944   No current outpatient medications on file.    Musculoskeletal: Strength & Muscle Tone: within normal limits Gait & Station: normal Patient leans: N/A  Psychiatric Specialty Exam:  Presentation  General Appearance:  Appropriate for Environment  Eye Contact: Fair  Speech: Clear and Coherent  Speech Volume: Normal  Handedness: Right   Mood and Affect  Mood: Depressed  Affect: Congruent   Thought Process  Thought Processes: Coherent  Descriptions of Associations:Circumstantial  Orientation:Full (Time, Place and Person)  Thought Content:Logical  History of Schizophrenia/Schizoaffective disorder:No data recorded Duration of Psychotic Symptoms:No data recorded Hallucinations:Hallucinations: None  Ideas of Reference:None  Suicidal Thoughts:Suicidal Thoughts: No  Homicidal  Thoughts:Homicidal Thoughts: No   Sensorium  Memory: Immediate Fair; Recent Fair; Remote Fair  Judgment: Fair  Insight: Present   Executive Functions  Concentration: Fair  Attention Span: Fair  Recall: [place] of Knowledge: Good  Language: Good   Psychomotor Activity  Psychomotor Activity:Psychomotor Activity: Normal   Assets  Assets: Housing; Physical Health; Social Support   Sleep  Sleep:Sleep: Poor   Physical Exam: Physical Exam Vitals and nursing note reviewed.  Constitutional:      Appearance: Normal appearance.  Eyes:     General:        Right eye: No discharge.        Left eye: No discharge.  Cardiovascular:  Pulses: Normal pulses.  Pulmonary:     Effort: No respiratory distress.     Breath sounds: No wheezing.  Neurological:     Mental Status: She is oriented to person, place, and time.     Motor: No weakness.  Psychiatric:        Attention and Perception: She does not perceive auditory or visual hallucinations.        Mood and Affect: Mood is depressed.        Speech: Speech normal.        Behavior: Behavior is cooperative.        Thought Content: Thought content is paranoid. Thought content is not delusional. Thought content does not include homicidal or suicidal ideation. Thought content does not include homicidal or suicidal plan.    Review of Systems  Constitutional:  Negative for chills and fever.  HENT:  Negative for ear discharge, hearing loss and sinus pain.   Eyes:  Negative for discharge and redness.  Respiratory:  Negative for cough, shortness of breath and wheezing.   Cardiovascular:  Negative for chest pain and palpitations.  Gastrointestinal:  Negative for abdominal pain, nausea and vomiting.  Musculoskeletal:  Negative for back pain and neck pain.  Neurological:  Negative for dizziness, seizures, weakness and headaches.  Psychiatric/Behavioral:  Positive for depression and substance abuse. Negative for  hallucinations and suicidal ideas.    Blood pressure 123/83, pulse 87, temperature 98.4 F (36.9 C), temperature source Oral, resp. rate 18, height '5\' 5"'$  (1.651 m), weight 110.7 kg, SpO2 99 %. Body mass index is 40.6 kg/m.  Treatment Plan Summary: Plan : Recommend inpatient admission into psychiatric facility for stabilization and treatment.  Disposition: Recommend psychiatric Inpatient admission when medically cleared.  Earney Mallet, NP 01/11/2023 12:03 PM

## 2023-01-11 NOTE — Progress Notes (Signed)
LCSW Progress Note  [id number]   ADABELLA MANSOUR  01/11/2023  1:03 PM  Description:   Inpatient Psychiatric Referral  Patient was recommended inpatient per Alver Sorrow, NP. There are no available beds at [facility]. Patient was referred to the following facilities:   Destination  Service Provider Address Phone Fax  [place]  [address], [place] [address] [phone]  [facility]  [address] [place] [address] [phone]  [place], [place] [phone]  [facility]  [place], [place] [address] [phone] [place] [address] [place] [address] [phone]  [facility]  [phone] [address] [place] [address] [phone] [facility]  [address] [place], [place] [address] [phone] [place] [place] [address] [place]  [facility]  [address], [place] [address] [phone]  [facility]  [address] [place] [address] [phone] [place], [place] [address] [phone] [facility]  [address] [place] [address] [phone]  [facility]  [address] [place] [address] [phone] [facility]  [address] [place] [address] [phone] [facility]  [place], [place] [address] [phone]  [facility] [address] [place] [address] [phone]  [facility]  [place], [place] [address] [phone]  [facility]  [place] [address], [place] [address] [phone]  [facility]  [place] [address] [phone] [facility]  [address] [place] [address] [phone]  [facility]  [address] [place] [address] [phone]    Situation ongoing, CSW to continue following and update chart as more information becomes available.      Denna Haggard, [place]  01/11/2023 1:03 PM

## 2023-01-11 NOTE — ED Notes (Signed)
Visitor at bedside. Family updated with pt permission.

## 2023-01-11 NOTE — BH Assessment (Signed)
Clinician messaged Rolene Arbour, RN: "Hey. It's Trey with TTS. Is the pt able to engage in the assessment, if so the pt will need to be placed in a private room. Is the pt under IVC? Also is the pt medically cleared? Can you fax the pt's IVC paperwork to [phone]?"  Clinician awaiting response.    Vertell Novak, [place], [facility], [facility] Triage Specialist [phone]

## 2023-01-11 NOTE — ED Provider Notes (Signed)
Emergency Medicine Observation Re-evaluation Note  Alexandra Ramsey is a 32 y.o. female, seen on rounds today.  Pt initially presented to the ED for complaints of Psychiatric Evaluation Currently, the patient is sleeping.  Physical Exam  BP 123/83 (BP Location: Right Arm)   Pulse 87   Temp 98.4 F (36.9 C) (Oral)   Resp 18   Ht '5\' 5"'$  (1.651 m)   Wt 110.7 kg   SpO2 99%   BMI 40.60 kg/m  Physical Exam General: NAD Cardiac: normal rate Lungs: equal chest rise Psych: resting comfortably  ED Course / MDM  EKG:   I have reviewed the labs performed to date as well as medications administered while in observation.  Recent changes in the last 24 hours include Patient brought here by police for increased aggression. Patient agitated here, IVC, chemically sedated. Medically cleared.  Awaiting TTS eval.  Plan  Current plan is for TTS eval.      Deno Etienne, DO 01/11/23 [phone]

## 2023-01-11 NOTE — BH Assessment (Signed)
Per Rolene Arbour, RN, the pt has been medicated and unable to engage. Clinician asked RN to contact her when the pt is able to engage in the assessment.    Vertell Novak, [place], [facility], [facility] Triage Specialist [phone]

## 2023-01-11 NOTE — Progress Notes (Signed)
Pt was accepted to [facility] 01/12/2023, pending IVC paperwork faxed to [phone]. Bed assignment: Main campus  Pt meets inpatient criteria per Alver Sorrow, NP  Attending Physician will be Leta Jungling, MD  Report can be called to: [phone] (this is a pager, please leave call-back number when giving report)  Pt can arrive after 8 AM  Care Team Notified: Alver Sorrow, NP, Cletis Media, RN and Abran Richard, RN  [place], LCSWA  01/11/2023 1:45 PM

## 2023-01-12 NOTE — ED Notes (Signed)
Gave possessions to [employer]

## 2023-01-12 NOTE — ED Notes (Signed)
IVC order legally required; must be renewed 7 days from date & time of issuance.

## 2023-01-12 NOTE — ED Provider Notes (Signed)
Emergency Medicine Observation Re-evaluation Note  Alexandra Ramsey is a 32 y.o. female, seen on rounds today.  Pt initially presented to the ED for complaints of Psychiatric Evaluation Currently, the patient is IVC and awaiting transfer to [facility] that supposed to happen today.  Patient on presentation expressed suicidal ideation and psychiatric illness.  Patient has a Actuary.Marland Kitchen  Physical Exam  BP 105/69 (BP Location: Left Arm)   Pulse 78   Temp 98.2 F (36.8 C) (Oral)   Resp 17   Ht 1.651 m (5\' 5" )   Wt 110.7 kg   SpO2 100%   BMI 40.60 kg/m  Physical Exam General: Nontoxic no acute distress Cardiac:  Lungs: No respiratory distress Psych: Baseline  ED Course / MDM  EKG:   I have reviewed the labs performed to date as well as medications administered while in observation.  Recent changes in the last 24 hours include nothing significant.  Plan  Current plan is for transfer to [facility] today.  Patient is IVC.    Fredia Sorrow, MD 01/12/23 [phone]

## 2023-01-12 NOTE — ED Notes (Signed)
Report was called to Caren Macadam at [facility] and sheriff called for transport

## 2023-06-03 ENCOUNTER — Emergency Department (HOSPITAL_COMMUNITY)
Admission: EM | Admit: 2023-06-03 | Discharge: 2023-06-04 | Discharge status: Against Medical Advice | Payer: 59 | Attending: Emergency Medicine | Admitting: Emergency Medicine

## 2023-06-03 ENCOUNTER — Ambulatory Visit (HOSPITAL_COMMUNITY)
Admission: EM | Admit: 2023-06-03 | Discharge: 2023-06-03 | Disposition: A | Discharge status: Other Acute Inpatient Hospital | Payer: 59 | Attending: Psychiatry | Admitting: Psychiatry

## 2023-06-03 ENCOUNTER — Encounter (HOSPITAL_COMMUNITY): Payer: Self-pay | Admitting: Emergency Medicine

## 2023-06-03 DIAGNOSIS — R45851 Suicidal ideations: Secondary | ICD-10-CM | POA: Insufficient documentation

## 2023-06-03 DIAGNOSIS — F600 Paranoid personality disorder: Secondary | ICD-10-CM | POA: Diagnosis not present

## 2023-06-03 DIAGNOSIS — F22 Delusional disorders: Secondary | ICD-10-CM

## 2023-06-03 DIAGNOSIS — F309 Manic episode, unspecified: Secondary | ICD-10-CM | POA: Insufficient documentation

## 2023-06-03 DIAGNOSIS — R9431 Abnormal electrocardiogram [ECG] [EKG]: Secondary | ICD-10-CM | POA: Diagnosis not present

## 2023-06-03 DIAGNOSIS — Z5329 Procedure and treatment not carried out because of patient's decision for other reasons: Secondary | ICD-10-CM | POA: Insufficient documentation

## 2023-06-03 MED ORDER — ACETAMINOPHEN 325 MG PO TABS: 650.0000 mg | ORAL_TABLET | Freq: Four times a day (QID) | ORAL | Status: DC | PRN

## 2023-06-03 MED ORDER — LORAZEPAM 1 MG PO TABS: 1.0000 mg | ORAL_TABLET | ORAL | Status: DC | PRN

## 2023-06-03 MED ORDER — ALUM & MAG HYDROXIDE-SIMETH 200-200-20 MG/5ML PO SUSP: 30.0000 mL | ORAL | Status: DC | PRN

## 2023-06-03 MED ORDER — OLANZAPINE 5 MG PO TBDP: 5.0000 mg | ORAL_TABLET | Freq: Three times a day (TID) | ORAL | Status: DC | PRN

## 2023-06-03 MED ORDER — MAGNESIUM HYDROXIDE 400 MG/5ML PO SUSP: 30.0000 mL | Freq: Every day | ORAL | Status: DC | PRN

## 2023-06-03 MED ORDER — OLANZAPINE 5 MG PO TABS
5.0000 mg | ORAL_TABLET | Freq: Two times a day (BID) | ORAL | Status: DC
Start: 2023-06-03 — End: 2023-06-03

## 2023-06-03 MED ORDER — ZIPRASIDONE MESYLATE 20 MG IM SOLR: 20.0000 mg | INTRAMUSCULAR | Status: DC | PRN

## 2023-06-03 MED ORDER — NICOTINE 21 MG/24HR TD PT24
21.0000 mg | MEDICATED_PATCH | Freq: Once | TRANSDERMAL | Status: DC
  Filled 2023-06-03: qty 1

## 2023-06-03 MED ORDER — HYDROXYZINE HCL 25 MG PO TABS: 25.0000 mg | ORAL_TABLET | Freq: Three times a day (TID) | ORAL | Status: DC | PRN

## 2023-06-03 MED ORDER — TRAZODONE HCL 50 MG PO TABS: 50.0000 mg | ORAL_TABLET | Freq: Every evening | ORAL | Status: DC | PRN

## 2023-06-03 NOTE — ED Notes (Signed)
Patient discharge to [facility] ed via [facility]pd due to ivc

## 2023-06-03 NOTE — ED Notes (Addendum)
Patient has been standing at the desk. Patient picked up the phone and called her mom. Patient has been made aware of the phone policy and the need to be seen by TTS. Mother called and ask about the status of the patients care.. Patient is currently walking the hallway with security.

## 2023-06-03 NOTE — BH Assessment (Signed)
Comprehensive Clinical Assessment (CCA) Note  06/03/2023 Alexandra Ramsey [id number] DISPOSITION: Alexandra Shy NP recommends an inpatient admission to assist with stabilization.   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 Acute risk factors for suicide include: family or marital conflict. Protective factors for this patient include: coping skills. Considering these factors, the overall suicide risk at this point appears to be moderate. Patient is not appropriate for outpatient follow up.   Patient is a 32 year old female that presents this date to [facility] by LEOs with IVC initiated by family member Alexandra Ramsey (mother) [phone]. Per IVC, "Respondent has been voicing S/I and attempted to jump out of a moving car last night. Respondent has been hearing voices telling her that her dead cousin is in the road. Respondent is paranoid and believes that someone is trying to kill her. Respondent has a prior history of commitments."   Patient denies any S/I, H/I or AVH at the time of assessment. Patient denies content of IVC stating that, "her family hates her and this is what they do." Patient renders limited history and is observed to be very agitated. Patient denies any current mental health OP services stating, "she isn't crazy and doesn't need to be on any medications."      Patient denies any current SA use stating, "I am not saying anything that may incriminate me." Per chart review patient has an extensive SA history and per note of 01/11/23 when she presented on 3/12 to [facility] with similar symptoms that required an inpatient admission reporting using multiple substances. Per note of 3/14 provider noted that family had been concerned for some time over patient's state of mental health. Family reported that patient at that time stated she had been experiencing AVH and also had been threatening to harm herself. No  specific psychiatric diagnosis could be found per chart review.    Patient's mother Alexandra Kaufmann Ramsey [phone] who initiated IVC this date could not be reached for collateral information. As mentioned patient is observed to be uncooperative and refuses to answer most questions associated with the assessment.   Patient is alert and oriented x 4. Patient is observed to be hostile, angry and speaks in a loud voice at times. Patient renders limited history. Patient's memory appears intact although thoughts somewhat disorganized. Patient's mood is angry with affect congruent. Patient does not appear to be responding to internal stimuli. Patient is guarded and suspicious.     Chief Complaint:  Chief Complaint  Patient presents with   Paranoid   Visit Diagnosis: Paranoia     CCA Screening, Triage and Referral (STR)  Patient Reported Information How did you hear about [place]? Legal System  What Is the Reason for Your Visit/Call Today? Pt presents to [facility] under IVC per [employer]. Pt sates, "I feel like I have been betrayed". Pt is tearful and anxious. Pt continues to say that she "does not need help". Pt continues to state, "I feel like my family is out to get me and I do not know why I am here." Pt denies the complaint listed on the IVC. Pt has no known disorders at this time. Pt denies substcance use, SI, HI and AVH at this time.  How Long Has This Been Causing You Problems? <Week  What Do You Feel Would Help You the Most Today? Treatment for Depression or other mood problem   Have You Recently Had Any Thoughts About Hurting Yourself?  Yes (Per IVC)  Are You Planning to Commit Suicide/Harm Yourself At This time? No   Flowsheet Row ED from 06/03/2023 in [facility] ED from 01/10/2023 in [facility] Emergency Department at [facility] ED from 01/09/2023 in [facility]  C-SSRS RISK CATEGORY High Risk Low Risk No Risk       Have you  Recently Had Thoughts About Hurting Someone Alexandra Ramsey? No  Are You Planning to Harm Someone at This Time? No  Explanation: NA   Have You Used Any Alcohol or Drugs in the Past 24 Hours? No  What Did You Use and How Much? NA   Do You Currently Have a Therapist/Psychiatrist? No  Name of Therapist/Psychiatrist: Name of Therapist/Psychiatrist: NA   Have You Been Recently Discharged From Any Office Practice or Programs? No  Explanation of Discharge From Practice/Program: NA     CCA Screening Triage Referral Assessment Type of Contact: Face-to-Face  Telemedicine Service Delivery:   Is this Initial or Reassessment?   Date Telepsych consult ordered in [facility]:    Time Telepsych consult ordered in [facility]:    Location of Assessment: [facility] Services  Provider Location: [facility] Assessment Services   Collateral Involvement: None at this time   Does Patient Have a Automotive engineer Guardian? No  Legal Guardian Contact Information: NA  Copy of Legal Guardianship Form: -- (NA)  Legal Guardian Notified of Arrival: -- (NA)  Legal Guardian Notified of Pending Discharge: -- (NA)  If Minor and Not Living with Parent(s), Who has Custody? NA  Is CPS involved or ever been involved? Never  Is APS involved or ever been involved? Never   Patient Determined To Be At Risk for Harm To Self or Others Based on Review of Patient Reported Information or Presenting Complaint? Yes, for Self-Harm (Per IVC)  Method: No Plan  Availability of Means: No access or NA  Intent: Vague intent or NA  Notification Required: No need or identified person  Additional Information for Danger to Others Potential: -- (NA)  Additional Comments for Danger to Others Potential: None noted  Are There Guns or Other Weapons in Your Home? No  Types of Guns/Weapons: NA  Are These Weapons Safely Secured?                            -- (NA)  Who Could Verify You Are Able To Have These Secured: NA  Do You  Have any Outstanding Charges, Pending Court Dates, Parole/Probation? UTA pt will not respond to question  Contacted To Inform of Risk of Harm To Self or Others: Other: Comment (NA)    Does Patient Present under Involuntary Commitment? Yes    [place] of Residence: [place]   Patient Currently Receiving the Following Services: Not Receiving Services   Determination of Need: Urgent (48 hours)   Options For Referral: Inpatient Hospitalization     CCA Biopsychosocial Patient Reported Schizophrenia/Schizoaffective Diagnosis in Past: No   Strengths: UTA   Mental Health Symptoms Depression:   Change in energy/activity   Duration of Depressive symptoms:  Duration of Depressive Symptoms: Less than two weeks   Mania:   None   Anxiety:    Difficulty concentrating; Irritability   Psychosis:   None   Duration of Psychotic symptoms:    Trauma:   None   Obsessions:   None   Compulsions:   None   Inattention:   None  Hyperactivity/Impulsivity:   None   Oppositional/Defiant Behaviors:   Argumentative; Angry   Emotional Irregularity:   None   Other Mood/Personality Symptoms:   None noted    Mental Status Exam Appearance and self-care  Stature:   Tall   Weight:   Average weight   Clothing:   Disheveled   Grooming:   Neglected   Cosmetic use:   None   Posture/gait:   Tense   Motor activity:   Agitated   Sensorium  Attention:   Inattentive   Concentration:   Anxiety interferes   Orientation:   X5   Recall/memory:   Normal   Affect and Mood  Affect:   Anxious   Mood:   Angry; Anxious   Relating  Eye contact:   Normal   Facial expression:   Angry   Attitude toward examiner:   Defensive   Thought and Language  Speech flow:  Clear and Coherent   Thought content:   Suspicious   Preoccupation:   None   Hallucinations:   None   Organization:   Coherent   [employer] of Knowledge:   Poor    Intelligence:   Average   Abstraction:   Normal   Judgement:   Poor   Reality Testing:   Realistic   Insight:   Poor   Decision Making:   Impulsive   Social Functioning  Social Maturity:   Irresponsible   Social Judgement:   "Chief of Staff"   Stress  Stressors:   Family conflict   Coping Ability:   Human resources officer Deficits:   Activities of daily living   Supports:   Support needed     Religion: Religion/Spirituality Are You A Religious Person?: No How Might This Affect Treatment?: NA  Leisure/Recreation: Leisure / Recreation Do You Have Hobbies?: No  Exercise/Diet: Exercise/Diet Do You Exercise?: No Have You Gained or Lost A Significant Amount of Weight in the Past Six Months?: No Do You Follow a Special Diet?: No Do You Have Any Trouble Sleeping?: No   CCA Employment/Education Employment/Work Situation: Employment / Work Situation Employment Situation: Unemployed Patient's Job has Been Impacted by Current Illness: No Has Patient ever Been in Equities trader?: No  Education: Education Is Patient Currently Attending School?: No Last Grade Completed: 12 Did You Product manager?: No Did You Have An Individualized Education Program (IIEP): No Did You Have Any Difficulty At [employer]?: No Patient's Education Has Been Impacted by Current Illness: No   CCA Family/Childhood History Family and Relationship History: Family history Marital status: Single Does patient have children?: No  Childhood History:  Childhood History By whom was/is the patient raised?: Mother Did patient suffer any verbal/emotional/physical/sexual abuse as a child?: No Did patient suffer from severe childhood neglect?: No Has patient ever been sexually abused/assaulted/raped as an adolescent or adult?: No Was the patient ever a victim of a crime or a disaster?: No Witnessed domestic violence?: No Has patient been affected by domestic violence as an adult?: No        CCA Substance Use Alcohol/Drug Use: Alcohol / Drug Use Pain Medications: See MAR Prescriptions: See MAR Over the Counter: See MAR History of alcohol / drug use?: Yes (Per hx although patient denies and will not answer any questions associated with SA use) Longest period of sobriety (when/how long): UTA Negative Consequences of Use:  (UTA) Withdrawal Symptoms: None  ASAM's:  Six Dimensions of Multidimensional Assessment  Dimension 1:  Acute Intoxication and/or Withdrawal Potential:   Dimension 1:  Description of individual's past and current experiences of substance use and withdrawal: UTA  Dimension 2:  Biomedical Conditions and Complications:   Dimension 2:  Description of patient's biomedical conditions and  complications: UTA  Dimension 3:  Emotional, Behavioral, or Cognitive Conditions and Complications:  Dimension 3:  Description of emotional, behavioral, or cognitive conditions and complications: UTA  Dimension 4:  Readiness to Change:  Dimension 4:  Description of Readiness to Change criteria: UTA  Dimension 5:  Relapse, Continued use, or Continued Problem Potential:  Dimension 5:  Relapse, continued use, or continued problem potential critiera description: UTA  Dimension 6:  Recovery/Living Environment:  Dimension 6:  Recovery/Iiving environment criteria description: UTA  ASAM Severity Score:    ASAM Recommended Level of Treatment: ASAM Recommended Level of Treatment:  (UTA)   Substance use Disorder (SUD) Substance Use Disorder (SUD)  Checklist Symptoms of Substance Use:  (UTA)  Recommendations for Services/Supports/Treatments: Recommendations for Services/Supports/Treatments Recommendations For Services/Supports/Treatments:  (UTA)  Discharge Disposition:    DSM5 Diagnoses: Patient Active Problem List   Diagnosis Date Noted   Paranoia (HCC) 01/11/2023   Substance use 01/11/2023   Obesity (BMI 30-39.9) 07/26/2011   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

## 2023-06-03 NOTE — Progress Notes (Signed)
   06/03/23 1549  BHUC Triage Screening (Walk-ins at [facility] only)  How Did You Hear About [place]? Legal System  What Is the Reason for Your Visit/Call Today? Pt presents to [facility] under IVC per [employer]. Pt sates, "I feel like I have been betrayed". Pt is tearful and anxious. Pt continues to say that she "does not need help". Pt continues to state, "I feel like my family is out to get me and I do not know why I am here." Pt has no known disorders at this time. Pt denies substcance use, SI, HI and AVH at this time.  How Long Has This Been Causing You Problems? <Week  Have You Recently Had Any Thoughts About Hurting Yourself? No  Are You Planning to Commit Suicide/Harm Yourself At This time? No  Have you Recently Had Thoughts About Hurting Someone [name]? No  Are You Planning To Harm Someone At This Time? No  Are you currently experiencing any auditory, visual or other hallucinations? No  Have You Used Any Alcohol or Drugs in the Past 24 Hours? No  Do you have any current medical co-morbidities that require immediate attention? No  Clinician description of patient physical appearance/behavior: pt is tearful, fairly groomed, agitated  What Do You Feel Would Help You the Most Today? Treatment for Depression or other mood problem  If access to [facility] was not available, would you have sought care in the Emergency Department? No  Determination of Need Urgent (48 hours)  Options For Referral Outpatient Therapy

## 2023-06-03 NOTE — ED Notes (Addendum)
Patient is now back standing outside of the room. Patient is frustrated at being IVC'd.

## 2023-06-03 NOTE — ED Provider Notes (Cosign Needed Addendum)
Informed by staff that police refuse to transport patient to [facility] ED.  There were multiple police officers present in the [place] port along with patient.  Police Nathanial Millman questioned this writer's judgment on why patient was being sent to the emergency department and even stated, "we are just trying to figure out why you cannot treat her here".  Discussed patient's presentation is paranoid, findings on why patient could not be treated here including some policy and procedures.  Lieutenant then twisted this writer's words and stated, "sounds like with her medical condition she should go by EMS".  This Clinical research associate quickly educated Kirt Boys that patient did not need EMS transport and was not having a medical emergency.  Tried to have officer explained the reason why patient could not be transported as requested.  The only explanation that could be provided was that "patient is refusing to go" explained to officer again the patient is under involuntary commitment.  This lieutenant inform me that they had discussed patient with their watch commander and they would not be transporting patient to the [facility] emergency department.   Of note patient was not cooperative while in the facility but never escalated and did not require any medications for agitation.  However while police were trying to decide if they were going to transport a patient who is under IVC patient began to escalate.  How could she not, she is an extremely paranoid patient whom is guarded and non trusting now has  multiple police officers surrounding  her.    Dr. Rosey Bath and Dr. Kirt Boys Cinderella contacted.   Informed by security later that police did eventually agree to transport patient but they are requesting for another officer to be present.

## 2023-06-03 NOTE — ED Notes (Signed)
IVC paperwork in orange

## 2023-06-03 NOTE — ED Triage Notes (Signed)
Pt here under IVC for medical clearance. Per IVC, pt tried to jump out of a moving car, has been suicidal, paranoid that people are trying to kill her. Pt has a history of inpatient admissions to [facility]. Pt refusing to answer questions, uncooperative.

## 2023-06-03 NOTE — ED Notes (Signed)
Patient ran out of the emergency exit while walking with security. This nurse made MD and Charge RN aware.

## 2023-06-03 NOTE — ED Notes (Signed)
Walked back to room. Patient ran out of the EMS bay.

## 2023-06-03 NOTE — ED Notes (Signed)
Pt refused to have vital signs taken. This tech attempted 3x.

## 2023-06-03 NOTE — Progress Notes (Signed)
Patient has been denied by [facility] due to no appropriate beds available. Patient meets BH inpatient criteria per Vernard Gambles, NP. Patient has been faxed out to the following facilities:   [facility] Pending - Request [address] [place] [phone]--OZDGU-YQIH [facility] Pending - Request Sent--[address] [place] [phone]--SAYTK-ZSWFU [facility] Pending - Request Sent--[address] [place] [phone]--TDVVO-HYW [facility] Pending - Request Sent--[address] [place] [phone]--WEXHB-ZJIRC [facility] Pending - Request [address] [place] [phone]--TIRWE-RXVQM [facility] Pending - Request Sent--[address] [place] [phone]--ASNKN-LZJQ[facility] Pending - Request Sent--[address] [facility] Pending - Request Sent--[address] [place] [phone]--[facility] Pending - Request [address] [place], [place] [phone]--HDQQI-WLNLGXQJ [facility] Pending - Request Sent--[address] [place] [phone]--ZCHYI-[facility] Pending - Request Sent--[address], [place] [phone]--[facility] Pending - Request Sent--[address] [place]  [phone]--[facility] Pending - [facility], [place] [phone]--HUTML-YYTKP [facility] Pending - Request [address], [place] [phone]--  Damita Dunnings, MSW, LCSW-A  5:03 PM 06/03/2023

## 2023-06-03 NOTE — Discharge Instructions (Signed)
Transfer patient to [facility] ED Dr. Myrlene Broker is the accepting MD

## 2023-06-03 NOTE — ED Provider Triage Note (Signed)
Emergency Medicine Provider Triage Evaluation Note  Alexandra Ramsey , a 32 y.o. female  was evaluated in triage.  Pt brought to the ED by PD under IVC.  Mother petitioned for IVC stating patient has been suicidal.  Patient uncooperative with history.  No complaints voiced from patient.  Review of Systems  Positive: See HPI Negative: See HPI  Physical Exam  Ht 5\' 5"  (1.651 m)   LMP  (LMP Unknown)   BMI 40.60 kg/m  Gen:   Awake, no distress   Resp:  Normal effort  MSK:   Moves extremities without difficulty  Other:  Agitated, yelling, cursing at staff, uncooperative with exam  Medical Decision Making  Medically screening exam initiated at 7:53 PM.  Appropriate orders placed.  Alexandra Ramsey was informed that the remainder of the evaluation will be completed by another provider, this initial triage assessment does not replace that evaluation, and the importance of remaining in the ED until their evaluation is complete.    Tonette Lederer, PA-C 06/03/23 1956

## 2023-06-03 NOTE — ED Notes (Signed)
Patient is currently refusing labs and to change out into purple scrubs. MD is aware. When asked about suicidal thoughts, patient states "I'm good".
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
## 2023-06-04 ENCOUNTER — Other Ambulatory Visit: Payer: Self-pay

## 2023-06-04 ENCOUNTER — Emergency Department (HOSPITAL_COMMUNITY)
Admission: EM | Admit: 2023-06-04 | Discharge: 2023-06-06 | Disposition: A | Discharge status: Other Acute Inpatient Hospital | Payer: 59 | Source: Home / Self Care

## 2023-06-04 ENCOUNTER — Encounter (HOSPITAL_COMMUNITY): Payer: Self-pay

## 2023-06-04 DIAGNOSIS — R9431 Abnormal electrocardiogram [ECG] [EKG]: Secondary | ICD-10-CM | POA: Diagnosis not present

## 2023-06-04 DIAGNOSIS — F1510 Other stimulant abuse, uncomplicated: Secondary | ICD-10-CM

## 2023-06-04 DIAGNOSIS — R45851 Suicidal ideations: Secondary | ICD-10-CM | POA: Insufficient documentation

## 2023-06-04 DIAGNOSIS — F1994 Other psychoactive substance use, unspecified with psychoactive substance-induced mood disorder: Secondary | ICD-10-CM

## 2023-06-04 DIAGNOSIS — F309 Manic episode, unspecified: Secondary | ICD-10-CM

## 2023-06-04 DIAGNOSIS — F22 Delusional disorders: Secondary | ICD-10-CM | POA: Insufficient documentation

## 2023-06-04 DIAGNOSIS — F600 Paranoid personality disorder: Secondary | ICD-10-CM | POA: Diagnosis not present

## 2023-06-04 DIAGNOSIS — F29 Unspecified psychosis not due to a substance or known physiological condition: Secondary | ICD-10-CM | POA: Insufficient documentation

## 2023-06-04 LAB — COMPREHENSIVE METABOLIC PANEL
ALT: 14 U/L (ref 0–44)
AST: 23 U/L (ref 15–41)
Albumin: 4.3 g/dL (ref 3.5–5.0)
Alkaline Phosphatase: 68 U/L (ref 38–126)
Anion gap: 9 (ref 5–15)
BUN: 6 mg/dL (ref 6–20)
CO2: 22 mmol/L (ref 22–32)
Calcium: 9.2 mg/dL (ref 8.9–10.3)
Chloride: 104 mmol/L (ref 98–111)
Creatinine, Ser: 0.9 mg/dL (ref 0.44–1.00)
GFR, Estimated: >60 mL/min (ref >60)
Glucose, Bld: 118 mg/dL — ABNORMAL HIGH (ref 70–99)
Potassium: 3.4 mmol/L — ABNORMAL LOW (ref 3.5–5.1)
Sodium: 135 mmol/L (ref 135–145)
Total Bilirubin: 1.1 mg/dL (ref 0.3–1.2)
Total Protein: 7.9 g/dL (ref 6.5–8.1)

## 2023-06-04 LAB — CBC
HCT: 41.4 % (ref 36.0–46.0)
Hemoglobin: 13 g/dL (ref 12.0–15.0)
MCH: 26.4 pg (ref 26.0–34.0)
MCHC: 31.4 g/dL (ref 30.0–36.0)
MCV: 84 fL (ref 80.0–100.0)
Platelets: 229 10*3/uL (ref 150–400)
RBC: 4.93 MIL/uL (ref 3.87–5.11)
RDW: 14.6 % (ref 11.5–15.5)
WBC: 9.5 10*3/uL (ref 4.0–10.5)
nRBC: 0 % (ref 0.0–0.2)

## 2023-06-04 LAB — HCG, SERUM, QUALITATIVE: Preg, Serum: NEGATIVE

## 2023-06-04 LAB — ACETAMINOPHEN LEVEL: Acetaminophen (Tylenol), Serum: <10 ug/mL — ABNORMAL LOW (ref 10–30)

## 2023-06-04 LAB — ETHANOL: Alcohol, Ethyl (B): <10 mg/dL (ref <10)

## 2023-06-04 LAB — SALICYLATE LEVEL: Salicylate Lvl: <7 mg/dL — ABNORMAL LOW (ref 7.0–30.0)

## 2023-06-04 NOTE — ED Notes (Signed)
Pt was changed out from her clothes into scrubs and was told to place everything into a belongings bag with her jewelry in a cup. Pt belongings were inventoried and pt was wanded by security.

## 2023-06-04 NOTE — ED Triage Notes (Signed)
Pt was IVC'd yesterday and ran from [facility] and [employer] brought pt back today.

## 2023-06-04 NOTE — ED Notes (Signed)
Pt moved from triage to purple bed 51. Pt calm and cooperative at this time

## 2023-06-04 NOTE — ED Notes (Signed)
Patients mom called. Gave her an update as the current status of the patient. Advised the patient had run through the emergency exit door.

## 2023-06-04 NOTE — ED Provider Notes (Signed)
[place] EMERGENCY DEPARTMENT AT [facility] Provider Note   CSN: [id number] Arrival date & time: 06/03/23  1935     History  Chief Complaint  Patient presents with   Medical Clearance    Alexandra Ramsey is a 32 y.o. female.  Patient is a 32 year old female with a history of mental illness who is presenting today by police from behavioral health urgent care under IVC.  It was reported that patient tried to jump out of a car today has been paranoid and thinks that her mother's girlfriend is out to get her.  Patient reports that she was sleeping and the police came and took her out of her home today and she reports she knows it is from her mother's girlfriend who does not like her.  She states she did get in an argument with her mom and her mother's girlfriend but she would never try to hurt anybody because she is not a violent person.  She denies wanting to hurt herself today however at behavioral health urgent care they reported that patient was more guarded and would not specifically answer that question.  She did report she tried to get out of the car because she feared for her life and did not think her friend was going where he said he was going.  She does not knowledge that she had been in [facility] recently and they wanted her to take medicine because she does not take it because she does not feel like she needs it.  She does not take any medication regularly reports she has been having normal menses and denies any medical complaints.  Patient is refusing blood work but is otherwise been cooperative.  Based on behavioral health urgent care's note they feel that patient needs inpatient admission and needed to wait at our facility because she was refusing to be wanted at theirs.  The history is provided by the patient and medical records.       Home Medications Prior to Admission medications   Not on File      Allergies    Ibuprofen    Review of Systems   Review of  Systems  Physical Exam Updated Vital Signs BP 109/84 (BP Location: Right Arm)   Pulse 73   Resp 18   Ht 5\' 5"  (1.651 m)   LMP  (LMP Unknown)   SpO2 100%   BMI 40.60 kg/m  Physical Exam Vitals and nursing note reviewed.  Constitutional:      General: She is not in acute distress.    Appearance: She is well-developed.  HENT:     Head: Normocephalic and atraumatic.  Eyes:     Pupils: Pupils are equal, round, and reactive to light.  Cardiovascular:     Rate and Rhythm: Normal rate and regular rhythm.     Heart sounds: Normal heart sounds. No murmur heard.    No friction rub.  Pulmonary:     Effort: Pulmonary effort is normal.     Breath sounds: Normal breath sounds. No wheezing or rales.  Abdominal:     General: Bowel sounds are normal. There is no distension.     Palpations: Abdomen is soft.     Tenderness: There is no abdominal tenderness. There is no guarding or rebound.  Musculoskeletal:        General: No tenderness. Normal range of motion.     Comments: No edema  Skin:    General: Skin is warm and dry.  Findings: No rash.  Neurological:     Mental Status: She is alert and oriented to person, place, and time. Mental status is at baseline.     Cranial Nerves: No cranial nerve deficit.  Psychiatric:        Behavior: Behavior normal.     Comments: Patient here is cooperative with exam.  She does not appear to be responding to internal stimuli or hallucinating.  She does seem a bit paranoid.  She is denying any suicidal or homicidal ideation     ED Results / Procedures / Treatments   Labs (all labs ordered are listed, but only abnormal results are displayed) Labs Reviewed  COMPREHENSIVE METABOLIC PANEL  ETHANOL  CBC  RAPID URINE DRUG SCREEN, HOSP PERFORMED  HCG, SERUM, QUALITATIVE  ACETAMINOPHEN LEVEL  SALICYLATE LEVEL    EKG None  Radiology No results found.  Procedures Procedures    Medications Ordered in ED Medications  nicotine (NICODERM CQ  - dosed in mg/24 hours) patch 21 mg (21 mg Transdermal Patient Refused/Not Given 06/03/23 2302)    ED Course/ Medical Decision Making/ A&P                                 Medical Decision Making Amount and/or Complexity of Data Reviewed External Data Reviewed: notes. Labs: ordered.   Patient presenting here from behavioral health urgent care for inpatient admission.  She is IVC done they completed the first exam there.  She was sent to [facility] because patient refused to be wanted.  Here patient has been cooperative but is refusing blood work.  Based on exam and vital signs patient is medically clear.  Patient was cooperative so was not given any medication because she was calm in the room.  TTS consulted however patient then ran out of the door.  Police were notified and are currently trying to find the patient.  When she returns she will most likely need medications to keep her from running in the future.        Final Clinical Impression(s) / ED Diagnoses Final diagnoses:  Paranoid behavior [facility])    Rx / DC Orders ED Discharge Orders     None         Gwyneth Sprout, MD 06/04/23 [phone]

## 2023-06-04 NOTE — ED Provider Triage Note (Signed)
Emergency Medicine Provider Triage Evaluation Note  Alexandra Ramsey , a 32 y.o. female  was evaluated in triage.  Pt brought in by a IVC.  Patient denies any SI or HI.  Feels otherwise fine and at baseline.  Review of Systems  Positive: As above Negative: As above  Physical Exam  BP 109/79 (BP Location: Right Arm)   Pulse 72   Temp 98.2 F (36.8 C) (Oral)   Resp 20   Ht 5\' 5"  (1.651 m)   Wt 110.7 kg   LMP  (LMP Unknown)   SpO2 100%   BMI 40.61 kg/m  Gen:   Awake, no distress   Resp:  Normal effort  MSK:   Moves extremities without difficulty  Other:  Regular rate and rhythm  Medical Decision Making  Medically screening exam initiated at 7:02 PM.  Appropriate orders placed.  AZUSA BODENHEIMER was informed that the remainder of the evaluation will be completed by another provider, this initial triage assessment does not replace that evaluation, and the importance of remaining in the ED until their evaluation is complete.     Arabella Merles, PA-C 06/04/23 Windell Moment

## 2023-06-05 ENCOUNTER — Inpatient Hospital Stay (HOSPITAL_COMMUNITY): Admission: AD | Admit: 2023-06-05 | Payer: 59 | Source: Intra-hospital

## 2023-06-05 DIAGNOSIS — R45851 Suicidal ideations: Secondary | ICD-10-CM | POA: Diagnosis not present

## 2023-06-05 DIAGNOSIS — F29 Unspecified psychosis not due to a substance or known physiological condition: Secondary | ICD-10-CM | POA: Insufficient documentation

## 2023-06-05 DIAGNOSIS — F600 Paranoid personality disorder: Secondary | ICD-10-CM | POA: Diagnosis not present

## 2023-06-05 DIAGNOSIS — F309 Manic episode, unspecified: Secondary | ICD-10-CM | POA: Diagnosis not present

## 2023-06-05 DIAGNOSIS — F22 Delusional disorders: Secondary | ICD-10-CM | POA: Diagnosis not present

## 2023-06-05 LAB — RAPID URINE DRUG SCREEN, HOSP PERFORMED
Amphetamines: POSITIVE — AB
Barbiturates: NOT DETECTED
Benzodiazepines: NOT DETECTED
Cocaine: NOT DETECTED
Opiates: NOT DETECTED
Tetrahydrocannabinol: POSITIVE — AB

## 2023-06-05 MED ORDER — OLANZAPINE 5 MG PO TBDP
5.0000 mg | ORAL_TABLET | Freq: Two times a day (BID) | ORAL | Status: DC
  Administered 2023-06-05 – 2023-06-06 (×3): 5 mg via ORAL
  Filled 2023-06-05 (×3): qty 1

## 2023-06-05 NOTE — ED Provider Notes (Signed)
[place] EMERGENCY DEPARTMENT AT [facility] Provider Note   CSN: [id number] Arrival date & time: 06/04/23  1517     History  Chief Complaint  Patient presents with   IVC    Alexandra Ramsey is a 32 y.o. female.  HPI Patient presenting for suicidal ideation and paranoia.  Medical history includes substance abuse.  She was seen at [facility] 2 days ago accompanied by [employer] under IVC.  Her mother filled out IVC paperwork after patient tried to jump from a moving car.  Patient has been reportedly having auditory hallucinations and paranoia.  Initial IVC was upheld at [facility].  Recommendations were for inpatient psychiatric admission.  She refused lab work and exam.  She was sent to [facility], ED to await inpatient psychiatric bed.  She was seen in the ED that night.  Shortly after her arrival, she was able to elope.  She returned to the ED today, brought in by [employer].  She currently denies any symptoms, including SI/HI/AVH.  She denies any drug or alcohol use since she eloped from the ED yesterday.    Home Medications Prior to Admission medications   Not on File      Allergies    Ibuprofen    Review of Systems   Review of Systems  Psychiatric/Behavioral:  Positive for hallucinations, self-injury and suicidal ideas. The patient is nervous/anxious.   All other systems reviewed and are negative.   Physical Exam Updated Vital Signs BP 109/79 (BP Location: Right Arm)   Pulse 72   Temp 98.2 F (36.8 C) (Oral)   Resp 20   Ht 5\' 5"  (1.651 m)   Wt 110.7 kg   LMP  (LMP Unknown)   SpO2 100%   BMI 40.61 kg/m  Physical Exam Vitals and nursing note reviewed.  Constitutional:      General: She is not in acute distress.    Appearance: Normal appearance. She is well-developed. She is not ill-appearing, toxic-appearing or diaphoretic.  HENT:     Head: Normocephalic and atraumatic.     Right Ear: External ear normal.     Left Ear: External ear normal.     Nose: Nose normal.      Mouth/Throat:     Mouth: Mucous membranes are moist.  Eyes:     Extraocular Movements: Extraocular movements intact.     Conjunctiva/sclera: Conjunctivae normal.  Cardiovascular:     Rate and Rhythm: Normal rate and regular rhythm.  Pulmonary:     Effort: Pulmonary effort is normal. No respiratory distress.  Abdominal:     General: Abdomen is flat.     Tenderness: There is no abdominal tenderness.  Musculoskeletal:        General: No swelling. Normal range of motion.     Cervical back: Normal range of motion and neck supple.  Skin:    General: Skin is warm and dry.     Capillary Refill: Capillary refill takes less than 2 seconds.     Coloration: Skin is not jaundiced or pale.  Neurological:     General: No focal deficit present.     Mental Status: She is alert and oriented to person, place, and time.  Psychiatric:        Mood and Affect: Mood normal.        Behavior: Behavior normal.     ED Results / Procedures / Treatments   Labs (all labs ordered are listed, but only abnormal results are displayed) Labs Reviewed  COMPREHENSIVE METABOLIC PANEL - Abnormal; Notable for the following components:      Result Value   Potassium 3.4 (*)    Glucose, Bld 118 (*)    All other components within normal limits  SALICYLATE LEVEL - Abnormal; Notable for the following components:   Salicylate Lvl <7.0 (*)    All other components within normal limits  ACETAMINOPHEN LEVEL - Abnormal; Notable for the following components:   Acetaminophen (Tylenol), Serum <10 (*)    All other components within normal limits  ETHANOL  CBC  HCG, SERUM, QUALITATIVE  RAPID URINE DRUG SCREEN, HOSP PERFORMED    EKG None  Radiology No results found.  Procedures Procedures    Medications Ordered in ED Medications - No data to display  ED Course/ Medical Decision Making/ A&P                                 Medical Decision Making  Patient brought back to the ED by [employer] following eloping from  the emergency department yesterday morning.  She was gone for approximately 18 hours.  She denies any drug or alcohol use during that time.  She denies any physical complaints.  She currently denies any SI, HI, or AVH.  Patient was recently seen at [facility], where she was evaluated with recommendations for inpatient psychiatric admission.  Despite her current denial of SI or hallucinations, new IVC paperwork was completed.  Lab work is unremarkable.  Patient is medically cleared.  TTS was consulted.         Final Clinical Impression(s) / ED Diagnoses Final diagnoses:  [facility])    Rx / DC Orders ED Discharge Orders     None         Gloris Manchester, MD 06/05/23 [phone]

## 2023-06-05 NOTE — Progress Notes (Signed)
Pt was accepted to [facility] TODAY 06/05/2023, pending IVC paperwork faxed to [phone]. Bed assignment: Oak unit  Pt meets inpatient criteria per Eligha Bridegroom, NP  Attending Physician will be Joylene Igo, AGPCNP  Report can be called to: [phone] (please wait to call report until pt is leaving with transport)  Pt can arrive anytime today; bed is ready now  Care Team Notified: Eligha Bridegroom, NP and [facility], RN  [place], [place]  06/05/2023 3:40 PM

## 2023-06-05 NOTE — ED Notes (Signed)
IVC paperwork completed expires 06/12/23. Original copy in red folder

## 2023-06-05 NOTE — ED Notes (Signed)
Patient sleeping

## 2023-06-05 NOTE — Consult Note (Signed)
  Pt originally presented to [facility] with concerns of suicidal ideations, paranoia, hallucinations, and possible psychosis. Pt was transferred to [facility] to wait for IP transfer.   Pt is sleeping for assessment, but her father was visiting and I spoke with him. He states she doesn't have a "mental illness diagnosis" and is asking about her urine drug screen. He stated she is perfectly normal and sweet until she starts using drugs then she goes psychotic. He states the last time this happened the drug was an ecstasy based mixture pill. He is unsure exactly. Explained to him that she has not been cooperative with UDS yet. I did inform him she will be transferred to [facility] for inpatient treatment which he was agreeable/happy with.   Pt has been accepted to [facility]. ED staff notified. IVC remains in place.

## 2023-06-05 NOTE — ED Notes (Signed)
Spoke to Letitia Libra, Charity fundraiser at [facility] at [employer]. She was told the sheriff was called and a message was left on the answering machine and waiting for call back. She was made aware that the patient might not come to the facility till tomorrow.

## 2023-06-05 NOTE — Progress Notes (Signed)
LCSW Progress Note  [id number]   Alexandra Ramsey  06/05/2023  3:08 PM  Description:   Inpatient Psychiatric Referral  Patient was recommended inpatient per Eligha Bridegroom, NP. There are no available beds at [facility], per [facility] Rona Ravens, RN. Patient was referred to the following out of network facilities:   [facility] Provider Address Phone Fax  [facility]  [address] [place] [address] [phone]  [facility]  [address] [place] [address] [phone]  [facility]  [address] [place] [address] [phone]  [facility]  [address] [place], [place] [address] [phone]  [facility]  [address], [place] [address] [phone]  [facility]  [address] [place], [place] [address] [phone]  [facility]  [address] [place] [address] [phone]  [facility]  [address] [place] [address] [phone]  [facility]  [address] [place], [place] [address] [phone]  [facility]  [address] [place] [address] [phone]  [facility]  [address] [place] [address] [phone]  [facility]  [address], [place] [address] [phone]  [facility]  [address], [place] [address] [phone]  [facility]  [address] [place] [address] [phone]  [facility]  [place] [phone]  Renella Cunas  [place] -- [phone]  [facility]  [address] [place] [place] [address] [phone]  [facility]  [address], [place] [address] [phone]  [facility]  [address] [place] [address] [phone]  [facility]  [address], [place] [address] [phone]  [facility]  [address] [place] [address] [phone]  [facility]  [address] [place] [address] [phone]  [facility]  [address] [place] [address] [phone]    Situation ongoing, CSW to continue following and update chart as more information becomes available.      Cathie Beams, [place]  06/05/2023 3:08 PM

## 2023-06-05 NOTE — ED Notes (Signed)
IVC PAPERWORK STATUS CURRENT 

## 2023-06-05 NOTE — Progress Notes (Addendum)
ADDENDUM  Per Rona Ravens, RN, pt's bed offer at [facility] has been rescinded due to no available bed. CSW will fax out.  Pt has been accepted to [facility] TODAY 06/05/2023, pending UDS and EKG. Bed assignment: 505-1  Pt meets inpatient criteria per Eligha Bridegroom, NP  Attending Physician will be Phineas Inches, MD  Report can be called to: - Adult unit: [phone]  Pt can arrive after pending items are received  Care Team Notified: [facility] Rona Ravens, RN, Eligha Bridegroom, NP, and [facility], RN  [place], [place]  06/05/2023 1:48 PM

## 2023-06-06 DIAGNOSIS — R45851 Suicidal ideations: Secondary | ICD-10-CM | POA: Diagnosis not present

## 2023-06-06 DIAGNOSIS — F309 Manic episode, unspecified: Secondary | ICD-10-CM | POA: Diagnosis not present

## 2023-06-06 DIAGNOSIS — F600 Paranoid personality disorder: Secondary | ICD-10-CM | POA: Diagnosis not present

## 2023-06-06 DIAGNOSIS — F22 Delusional disorders: Secondary | ICD-10-CM | POA: Diagnosis not present

## 2023-06-06 NOTE — ED Provider Notes (Signed)
Emergency Medicine Observation Re-evaluation Note  Alexandra Ramsey is a 32 y.o. female, seen on rounds today.  Pt initially presented to the ED for complaints of substance use disorder, and related mood disorder/psychosis. Currently calm, nad.   Physical Exam  BP 93/60 (BP Location: Left Arm)   Pulse (!) 58   Temp 97.7 F (36.5 C) (Oral)   Resp 16   Ht 1.651 m (5\' 5" )   Wt 110.7 kg   LMP  (LMP Unknown)   SpO2 100%   BMI 40.61 kg/m  Physical Exam General: resting.  Cardiac: regular rate.  Lungs: breathing comfortably.  Psych: calm.  ED Course / MDM   I have reviewed the labs performed to date as well as medications administered while in observation.  Recent changes in the last 24 hours include ED obs, reassessment.   Plan  Patient has been accepted to inpatient psych, [facility], provider [place].   Vitals stable. No distress. Pt currently appears stable for transport/transfer.     Cathren Laine, MD 06/06/23 [phone]

## 2023-06-06 NOTE — ED Notes (Signed)
I Called  Sheriff to pick up pt  Alexandra Ramsey, ETA  is two hours

## 2023-06-06 NOTE — ED Notes (Signed)
Patient left with [facility] PD in stable condition with her belongings.  Report given to [facility].

## 2023-06-06 NOTE — Progress Notes (Signed)
Pt has been accepted to [facility] TODAY 06/06/2023. Bed assignment: 500-1  Pt meets inpatient criteria per Arsenio Loader, NP  Attending Physician will be Phineas Inches, MD  Report can be called to: - Adult unit: [phone]  Pt can arrive after pending discharges  Care Team Notified: [facility] Rona Ravens, RN, Aletta Edouard, RN, Arsenio Loader, NP, Florentina Addison, RN, and Delorise Royals, RN  [place], [place]  06/06/2023 11:20 AM

## 2023-06-06 NOTE — ED Notes (Signed)
Patient has been calm, appropriate, cooperative, and has a visitor at this time.

## 2023-06-06 NOTE — ED Notes (Signed)
Called [phone] to give report, advised I could only give report when transport was here.

## 2023-06-23 ENCOUNTER — Other Ambulatory Visit: Payer: Self-pay

## 2023-06-23 ENCOUNTER — Emergency Department (HOSPITAL_COMMUNITY)
Admission: EM | Admit: 2023-06-23 | Discharge: 2023-06-23 | Disposition: A | Discharge status: Home / Self Care | Payer: 59 | Attending: Emergency Medicine | Admitting: Emergency Medicine

## 2023-06-23 DIAGNOSIS — R519 Headache, unspecified: Secondary | ICD-10-CM

## 2023-06-23 DIAGNOSIS — G43909 Migraine, unspecified, not intractable, without status migrainosus: Secondary | ICD-10-CM | POA: Insufficient documentation

## 2023-06-23 MED ORDER — DEXAMETHASONE SODIUM PHOSPHATE 10 MG/ML IJ SOLN
10.0000 mg | Freq: Once | INTRAMUSCULAR | Status: AC
  Administered 2023-06-23: 10 mg via INTRAMUSCULAR
  Filled 2023-06-23: qty 1

## 2023-06-23 MED ORDER — ACETAMINOPHEN 500 MG PO TABS
1000.0000 mg | ORAL_TABLET | Freq: Once | ORAL | Status: DC
  Filled 2023-06-23: qty 2

## 2023-06-23 MED ORDER — PROCHLORPERAZINE MALEATE 5 MG PO TABS
10.0000 mg | ORAL_TABLET | Freq: Once | ORAL | Status: AC
  Administered 2023-06-23: 10 mg via ORAL
  Filled 2023-06-23: qty 2

## 2023-06-23 NOTE — Discharge Instructions (Signed)
You were evaluated today for a headache.  You may take Tylenol at home for continued symptom management.  If you develop any life-threatening symptoms please return to the emergency department.

## 2023-06-23 NOTE — ED Provider Notes (Signed)
[place] EMERGENCY DEPARTMENT AT [facility] Provider Note   CSN: [id number] Arrival date & time: 06/23/23  4098     History  Chief Complaint  Patient presents with   Migraine    Alexandra Ramsey is a 32 y.o. female.  Patient presents to the emergency department complaining of a headache across the front of her head with associated photophobia that began a few hours prior to arrival.  She denies nausea, vomiting, this being the worst headache of her life, sudden onset of headache.  She states he has a history of similar headaches in the past which have responded well to Tylenol.  She states she is unable to take ibuprofen due to ulcerative colitis.  Past medical history significant for ulcerative colitis, substance use  HPI     Home Medications Prior to Admission medications   Not on File      Allergies    Ibuprofen    Review of Systems   Review of Systems  Physical Exam Updated Vital Signs BP 133/85 (BP Location: Right Arm)   Pulse 86   Temp 98.1 F (36.7 C) (Oral)   Resp 18   LMP  (LMP Unknown)   SpO2 100%  Physical Exam Vitals and nursing note reviewed.  HENT:     Head: Normocephalic and atraumatic.  Eyes:     Pupils: Pupils are equal, round, and reactive to light.  Pulmonary:     Effort: Pulmonary effort is normal. No respiratory distress.  Musculoskeletal:        General: No signs of injury.     Cervical back: Normal range of motion.  Skin:    General: Skin is dry.  Neurological:     Mental Status: She is alert.  Psychiatric:        Speech: Speech normal.        Behavior: Behavior normal.     ED Results / Procedures / Treatments   Labs (all labs ordered are listed, but only abnormal results are displayed) Labs Reviewed - No data to display  EKG None  Radiology No results found.  Procedures Procedures    Medications Ordered in ED Medications  dexamethasone (DECADRON) injection 10 mg (10 mg Intramuscular Given 06/23/23 0533)   prochlorperazine (COMPAZINE) tablet 10 mg (10 mg Oral Given 06/23/23 0533)    ED Course/ Medical Decision Making/ A&P                                 Medical Decision Making Risk Prescription drug management.   This patient presents to the ED for concern of headache, this involves an extensive number of treatment options, and is a complaint that carries with it a high risk of complications and morbidity.  The differential diagnosis includes migraine, tension headache, cluster headache, other headache disorder   Co morbidities that complicate the patient evaluation  Paranoia, substance use   Additional history obtained:   External records from outside source obtained and reviewed including behavioral health notes showing recent inpatient hospitalization due to substance use disorder, mood disorder/psychosis   Imaging Studies ordered:  I considered a head CT but the patient had no red flag symptoms such as worst headache of her life or sudden onset.  She describes the headache is consistent with previous episodes    Problem List / ED Course / Critical interventions / Medication management   I ordered medication including Decadron and Compazine for  headache Reevaluation of the patient after these medicines showed that the patient improved I have reviewed the patients home medicines and have made adjustments as needed   Test / Admission - Considered:  The patient's headache has no alarming symptoms and improved with medication.  Plan to discharge home at this time with plans for to continue over-the-counter Tylenol at home.  Return precautions provided.         Final Clinical Impression(s) / ED Diagnoses Final diagnoses:  Bad headache    Rx / DC Orders ED Discharge Orders     None         Pamala Duffel 06/23/23 0539    Sabas Sous, MD 06/24/23 [phone]

## 2023-06-23 NOTE — ED Triage Notes (Signed)
Patient reports migraine headache with photophobia this morning .

## 2023-07-04 ENCOUNTER — Telehealth: Payer: Self-pay

## 2023-07-04 NOTE — Telephone Encounter (Signed)
Transition Care Management Follow-up Telephone Call Date of discharge and from where: 06/23/2023 [facility] How have you been since you were released from the hospital? Patient declined to participate.  Alexandra Ramsey [name]  [facility] Community Resource Care Guide   [email or web address]  ?? [phone]   Website: [email or web address]  [email or web address]

## 2023-12-08 ENCOUNTER — Encounter (HOSPITAL_COMMUNITY): Payer: Self-pay

## 2023-12-08 ENCOUNTER — Ambulatory Visit (INDEPENDENT_AMBULATORY_CARE_PROVIDER_SITE_OTHER): Payer: MEDICAID

## 2023-12-08 ENCOUNTER — Ambulatory Visit (HOSPITAL_COMMUNITY)
Admission: EM | Admit: 2023-12-08 | Discharge: 2023-12-08 | Disposition: A | Discharge status: Home / Self Care | Payer: MEDICAID | Attending: Physician Assistant | Admitting: Physician Assistant

## 2023-12-08 DIAGNOSIS — M5440 Lumbago with sciatica, unspecified side: Secondary | ICD-10-CM | POA: Diagnosis not present

## 2023-12-08 DIAGNOSIS — G8929 Other chronic pain: Secondary | ICD-10-CM

## 2023-12-08 DIAGNOSIS — R4586 Emotional lability: Secondary | ICD-10-CM

## 2023-12-08 MED ORDER — PREDNISONE 20 MG PO TABS: ORAL_TABLET | ORAL | 0 refills | Status: AC

## 2023-12-08 NOTE — Discharge Instructions (Addendum)
 Your back x-ray was negative for acute fracture or malalignment.  At this time I recommend that you establish with a PCP for ongoing management of your back pain as well as your emotional wellbeing concerns. I have sent in a script for Prednisone  taper to be taken in the morning with breakfast per the instructions on the container Remember that steroids can cause sleeplessness, irritability, increased hunger and elevated glucose levels so be mindful of these side effects. They should lessen as you progress to the lower doses of the taper. If desired you can go see [facility] which is a orthopedic urgent care that has walk-in availability.  They might be able to set you up with physical therapy as well as ongoing management for your back pain.  In addition to the steroid you can take Tylenol  as needed for pain management.  I also recommend using warm compresses, gentle stretches and massage as tolerated.  Please make sure that you are wearing supportive shoes if you are doing any heavy lifting.  I have included some stretches for you to help with preventing stiffness and improving your range of motion.  They should not be excruciatingly painful and if they are please stop them  [facility] [address] [place], [place] [address] [phone]  [facility] [address] [place], [place] [address] HelpLine: [phone] or [phone]   To help with your emotional wellbeing concerns have included information for the [facility] hospital.  Should you have any concerns for thoughts of hurting yourself or more severe symptoms please give them a call or check yourself in.  They may also have further resources available to help you get established with ongoing care.
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
## 2023-12-16 ENCOUNTER — Encounter (HOSPITAL_COMMUNITY): Payer: Self-pay

## 2023-12-16 ENCOUNTER — Ambulatory Visit (HOSPITAL_COMMUNITY)
Admission: EM | Admit: 2023-12-16 | Discharge: 2023-12-16 | Disposition: A | Discharge status: Home / Self Care | Payer: MEDICAID | Attending: Emergency Medicine | Admitting: Emergency Medicine

## 2023-12-16 DIAGNOSIS — M549 Dorsalgia, unspecified: Secondary | ICD-10-CM

## 2023-12-16 DIAGNOSIS — S161XXA Strain of muscle, fascia and tendon at neck level, initial encounter: Secondary | ICD-10-CM | POA: Diagnosis not present

## 2023-12-16 MED ORDER — KETOROLAC TROMETHAMINE 60 MG/2ML IM SOLN
30.0000 mg | Freq: Once | INTRAMUSCULAR | Status: AC
  Administered 2023-12-16: 30 mg via INTRAMUSCULAR

## 2023-12-16 MED ORDER — KETOROLAC TROMETHAMINE 30 MG/ML IJ SOLN
INTRAMUSCULAR | Status: AC
  Filled 2023-12-16: qty 1

## 2023-12-16 MED ORDER — METHOCARBAMOL 500 MG PO TABS: 500.0000 mg | ORAL_TABLET | Freq: Two times a day (BID) | ORAL | 0 refills | Status: AC

## 2023-12-16 NOTE — Discharge Instructions (Addendum)
 Take the Robaxin twice daily for muscle pain.  Do not drink alcohol or drive on this medication as it may cause sedation.  You can also do gentle stretching, warm showers and warm compresses.  You can take Tylenol 500 mg every 8 hours as needed for pain.  If your pain persists beyond the next week you can consider following up with an orthopedic.  Return to clinic for any new or urgent symptoms.
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
## 2024-02-23 ENCOUNTER — Encounter (HOSPITAL_BASED_OUTPATIENT_CLINIC_OR_DEPARTMENT_OTHER): Payer: Self-pay

## 2024-02-23 ENCOUNTER — Other Ambulatory Visit: Payer: Self-pay

## 2024-02-23 ENCOUNTER — Emergency Department (HOSPITAL_BASED_OUTPATIENT_CLINIC_OR_DEPARTMENT_OTHER)
Admission: EM | Admit: 2024-02-23 | Discharge: 2024-02-23 | Disposition: A | Discharge status: Home / Self Care | Payer: MEDICAID | Attending: Emergency Medicine | Admitting: Emergency Medicine

## 2024-02-23 DIAGNOSIS — K0889 Other specified disorders of teeth and supporting structures: Secondary | ICD-10-CM | POA: Diagnosis present

## 2024-02-23 DIAGNOSIS — R519 Headache, unspecified: Secondary | ICD-10-CM | POA: Diagnosis not present

## 2024-02-23 MED ORDER — HYDROCODONE-ACETAMINOPHEN 5-325 MG PO TABS: 1.0000 | ORAL_TABLET | ORAL | 0 refills | Status: DC | PRN

## 2024-02-23 MED ORDER — PENICILLIN V POTASSIUM 500 MG PO TABS: 500.0000 mg | ORAL_TABLET | Freq: Four times a day (QID) | ORAL | 0 refills | Status: DC

## 2024-02-23 MED ORDER — HYDROCODONE-ACETAMINOPHEN 5-325 MG PO TABS: 1.0000 | ORAL_TABLET | ORAL | 0 refills | Status: AC | PRN

## 2024-02-23 MED ORDER — PENICILLIN V POTASSIUM 500 MG PO TABS: 500.0000 mg | ORAL_TABLET | Freq: Four times a day (QID) | ORAL | 0 refills | Status: AC

## 2024-02-23 NOTE — ED Provider Notes (Signed)
 6:40 PM Pt requested different pharmacy.  Previous provider has left.  I sent in the medications as previously prescribed, to new pharmacy.   Lyna Sandhoff, PA-C 02/23/24 1840    Rolinda Climes, DO 02/23/24 2315

## 2024-02-23 NOTE — ED Provider Notes (Signed)
 [facility] AT [facility] Provider Note   CSN: [id number] Arrival date & time: 02/23/24  1715     History  Chief Complaint  Patient presents with   Dental Pain    Alexandra Ramsey is a 33 y.o. female.  33 year old female with a past medical history of UC presents to the ED with a chief complaint of dental pain which has been ongoing for 5 years.  She reports she chipped her tooth approximately 5 years ago, over the last 3 days has had increase in pain, exacerbated with mastication along with any type of oral intake.  She has tried some Tylenol  and ibuprofen  without much improvement in symptoms.  She does have appointment scheduled on Monday in order to have her tooth pulled by a dentist.  She has had some left facial pain as well.  No fevers, no vomiting, no other complaints.  The history is provided by the patient.  Dental Pain Associated symptoms: no fever        Home Medications Prior to Admission medications   Medication Sig Start Date End Date Taking? Authorizing Provider  HYDROcodone -acetaminophen  (NORCO/VICODIN) 5-325 MG tablet Take 1 tablet by mouth every 4 (four) hours as needed for up to 3 days. 02/23/24 02/26/24 Yes Clayborne Divis, PA-C  penicillin  v potassium (VEETID) 500 MG tablet Take 1 tablet (500 mg total) by mouth 4 (four) times daily for 7 days. 02/23/24 03/01/24 Yes Rylynn Schoneman, PA-C  methocarbamol  (ROBAXIN ) 500 MG tablet Take 1 tablet (500 mg total) by mouth 2 (two) times daily. 12/16/23   Harlow Lighter, [place]  N, FNP  predniSONE  (DELTASONE ) 20 MG tablet Take 60mg  PO daily x 2 days, then40mg  PO daily x 2 days, then 20mg  PO daily x 3 days 12/08/23   Mecum, Erin E, PA-C      Allergies    Ibuprofen     Review of Systems   Review of Systems  Constitutional:  Negative for fever.  HENT:  Positive for dental problem.     Physical Exam Updated Vital Signs BP 109/83 (BP Location: Left Arm)   Pulse 76   Temp 97.8 F (36.6 C) (Oral)   Resp 18    SpO2 100%  Physical Exam Vitals and nursing note reviewed.  Constitutional:      Appearance: Normal appearance.  HENT:     Mouth/Throat:     Mouth: Mucous membranes are moist.     Comments: Poor dentition throughout, second molar appears missing with erythema and pus present. Ttp.  Oropharynx is clear, uvula is midline. Cardiovascular:     Rate and Rhythm: Normal rate.  Pulmonary:     Effort: Pulmonary effort is normal.  Abdominal:     General: Abdomen is flat.  Musculoskeletal:     Cervical back: Normal range of motion and neck supple.  Skin:    General: Skin is warm and dry.  Neurological:     Mental Status: She is alert and oriented to person, place, and time.     ED Results / Procedures / Treatments   Labs (all labs ordered are listed, but only abnormal results are displayed) Labs Reviewed - No data to display  EKG None  Radiology No results found.  Procedures Procedures    Medications Ordered in ED Medications - No data to display  ED Course/ Medical Decision Making/ A&P  Medical Decision Making Risk Prescription drug management.    Patient presents to the ED with dental pain has been ongoing for the past 5 years, however exacerbated over the past 3 years.  She has tried taking some over-the-counter medication without much improvement in symptoms.  She does have an appointment scheduled with a dentist who will evaluate her on Monday.  She does report some malodorous smell in her mouth, feels like she taste something sour.  Exacerbated with any type of mastication.  On evaluation there is poor dentition throughout, does have a prior history of tobacco use, there is pus present around the second molar with some surrounding erythema, concern for dental abscess at this time.  She does have an appointment scheduled with dentist which will ultimately help, however placed on a short course of antibiotics along with pain control to  help with her symptoms.  She is agreeable to plan and treatment.  Turn precautions discussed at length, patient stable for discharge.    Portions of this note were generated with Scientist, clinical (histocompatibility and immunogenetics). Dictation errors may occur despite best attempts at proofreading.   Final Clinical Impression(s) / ED Diagnoses Final diagnoses:  Pain, dental    Rx / DC Orders ED Discharge Orders          Ordered    HYDROcodone -acetaminophen  (NORCO/VICODIN) 5-325 MG tablet  Every 4 hours PRN        02/23/24 1800    penicillin  v potassium (VEETID) 500 MG tablet  4 times daily        02/23/24 1800              Cyenna Rebello, PA-C 02/23/24 1804    Rolinda Climes, DO 02/23/24 2255

## 2024-02-23 NOTE — ED Triage Notes (Signed)
 Chipped tooth 5 years ago. Pain starting 3 days ago. Feels like it may be infected, bad taste in her mouth when she messes with the tooth. Denies fevers.  Scheduled to have tooth pulled 4/28.

## 2024-02-23 NOTE — Discharge Instructions (Signed)
 Your regular medication ordered to help treat your dental infection, please take 1 tablet 4 times a day for the next 7 days.  In addition, you were given medication to help with your pain, please only take this for severe pain.  You may also take Motrin  to alternate.  Follow-up with your dentist at your scheduled appointment.

## 2024-04-21 ENCOUNTER — Encounter (HOSPITAL_COMMUNITY): Payer: Self-pay

## 2024-04-21 ENCOUNTER — Ambulatory Visit (HOSPITAL_COMMUNITY)
Admission: EM | Admit: 2024-04-21 | Discharge: 2024-04-21 | Disposition: A | Discharge status: Home / Self Care | Payer: MEDICAID

## 2024-04-21 DIAGNOSIS — D234 Other benign neoplasm of skin of scalp and neck: Secondary | ICD-10-CM | POA: Diagnosis not present

## 2024-04-21 NOTE — Discharge Instructions (Signed)
 The mass in your head appears to be a cyst.  Please follow-up with a general surgeon for further evaluation and management.  Contact information has been provided for you to call and make an appointment.

## 2024-04-21 NOTE — ED Triage Notes (Signed)
 Patient presents to the office for knot on the left side of patient scalp x 1 year. Denies any drainage.

## 2024-04-21 NOTE — ED Provider Notes (Signed)
 [facility]    CSN: [id number] Arrival date & time: 04/21/24  1057      History   Chief Complaint No chief complaint on file.   HPI Alexandra Ramsey is a 33 y.o. female.   Patient presents today with a knot on the left side of her scalp that has been ongoing for the past year.  She reports sometimes the area is painful but most of the time it does not bother her.  She denies any drainage from the area.  No recent trauma, fall, accident, or injury to the area.  She does not think the area is changing in size at all.  She wants make sure this area is not a tumor.  She denies significant headache, vision changes, weakness, trouble with speech, or memory trouble.  She does not have a primary care provider.      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      Home Medications    Prior to Admission medications   Medication Sig Start Date End Date Taking? Authorizing Provider  methocarbamol  (ROBAXIN ) 500 MG tablet Take 1 tablet (500 mg total) by mouth 2 (two) times daily. 12/16/23    Dreama, [place]  N, FNP  predniSONE  (DELTASONE ) 20 MG tablet Take 60mg  PO daily x 2 days, then40mg  PO daily x 2 days, then 20mg  PO daily x 3 days 12/08/23   Mecum, Rocky BRAVO, PA-C    Family History Family History  Problem Relation Age of Onset   Colon polyps Father    Hypertension Father    Hypertension Mother    Colon polyps Maternal Grandmother    Colon polyps Paternal Grandmother    Inflammatory bowel disease Neg Hx     Social History Social History   Tobacco Use   Smoking status: Every Day    Current packs/day: 0.50    Average packs/day: 0.5 packs/day for 7.0 years (3.5 ttl pk-yrs)    Types: Cigarettes   Smokeless tobacco: Never  Vaping Use   Vaping status: Never Used  Substance Use Topics   Alcohol use: Yes    Alcohol/week: 2.0 standard drinks of alcohol    Types: 2 Cans of beer per week    Comment: socially   Drug use: Yes    Frequency: 3.0 times per week    Types: Marijuana, Cocaine    Comment: ecstasy; Pt denies cocaine and ecstasy use, says she uses marijuana occasionally     Allergies   Ibuprofen    Review of Systems Review  of Systems Per HPI  Physical Exam Triage Vital Signs ED Triage Vitals [04/21/24 1149]  Encounter Vitals Group     BP 125/85     Girls Systolic BP Percentile      Girls Diastolic BP Percentile      Boys Systolic BP Percentile      Boys Diastolic BP Percentile      Pulse Rate 72     Resp 18     Temp 98.2 F (36.8 C)     Temp Source Oral     SpO2 98 %     Weight      Height      Head Circumference      Peak Flow      Pain Score      Pain Loc      Pain Education      Exclude from Growth Chart    No data found.  Updated Vital Signs BP 125/85 (BP Location: Left Arm)   Pulse 72   Temp 98.2 F (36.8 C) (Oral)   Resp 18   SpO2 98%   Visual Acuity Right Eye Distance:   Left Eye Distance:   Bilateral Distance:    Right Eye Near:   Left Eye Near:    Bilateral Near:     Physical Exam Vitals and nursing note  reviewed.  Constitutional:      General: She is not in acute distress.    Appearance: Normal appearance. She is not toxic-appearing.  HENT:     Mouth/Throat:     Mouth: Mucous membranes are moist.     Pharynx: Oropharynx is clear.  Pulmonary:     Effort: Pulmonary effort is normal. No respiratory distress.   Skin:    General: Skin is warm and dry.     Capillary Refill: Capillary refill takes less than 2 seconds.     Findings: Lesion present.     Comments: Approximately 1.5 x 1 cm mass noted to left occipital scalp.  There is no hyperpigmentation, erythema, fluctuance, induration, warmth, active drainage.  The mass does not appear to be mobile.   Neurological:     Mental Status: She is alert and oriented to person, place, and time.   Psychiatric:        Behavior: Behavior is cooperative.      UC Treatments / Results  Labs (all labs ordered are listed, but only abnormal results are displayed) Labs Reviewed - No data to display  EKG   Radiology No results found.  Procedures Procedures (including critical care time)  Medications Ordered in UC Medications - No data to display  Initial Impression / Assessment and Plan / UC Course  I have reviewed the triage vital signs and the nursing notes.  Pertinent labs & imaging results that were available during my care of the patient were reviewed by me and considered in my medical decision making (see chart for details).   Patient is well-appearing, normotensive, afebrile, not tachycardic, not tachypneic, oxygenating well on room air.   1. Dermoid cyst of scalp Mass is consistent with cyst I recommended follow up with General Surgery to further characterize and discuss treatment options if indicated ER precautions discussed   The patient was given the opportunity to ask questions.  All questions answered to their satisfaction.  The patient is in agreement to this plan.   Final Clinical Impressions(s) / UC Diagnoses   Final  diagnoses:  Dermoid cyst of scalp     Discharge Instructions  The mass in your head appears to be a cyst.  Please follow-up with a general surgeon for further evaluation and management.  Contact information has been provided for you to call and make an appointment.    ED Prescriptions   None    PDMP not reviewed this encounter.   Chandra Harlene LABOR, NP 04/21/24 [phone]

## 2024-06-20 ENCOUNTER — Ambulatory Visit (HOSPITAL_COMMUNITY)
Admission: EM | Admit: 2024-06-20 | Discharge: 2024-06-20 | Discharge status: Against Medical Advice | Payer: MEDICAID | Attending: Family Medicine | Admitting: Family Medicine

## 2024-06-20 NOTE — ED Notes (Signed)
 Patient called for triage x 1. No answer.

## 2024-06-20 NOTE — ED Notes (Signed)
No answer from lobby  

## 2024-07-30 ENCOUNTER — Telehealth (HOSPITAL_COMMUNITY): Payer: Self-pay

## 2024-07-30 NOTE — Telephone Encounter (Signed)
 Pt's chart was accessed due to pt being on a backlogged list of patients needing assistance with PCP placement.  Per chart review, pt has PCP appt on 08/28/24. No further action taken at this time.

## 2024-08-28 ENCOUNTER — Ambulatory Visit: Payer: MEDICAID | Admitting: Family Medicine

## 2024-10-14 ENCOUNTER — Emergency Department (HOSPITAL_COMMUNITY)
Admission: EM | Admit: 2024-10-14 | Discharge: 2024-10-14 | Disposition: A | Discharge status: Home / Self Care | Payer: MEDICAID | Attending: Emergency Medicine | Admitting: Emergency Medicine

## 2024-10-14 ENCOUNTER — Other Ambulatory Visit: Payer: Self-pay

## 2024-10-14 ENCOUNTER — Encounter (HOSPITAL_COMMUNITY): Payer: Self-pay

## 2024-10-14 DIAGNOSIS — D234 Other benign neoplasm of skin of scalp and neck: Secondary | ICD-10-CM
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
## 2024-10-14 NOTE — ED Triage Notes (Signed)
 C/O knot on head that's been there for 1 month. C/O headaches/blurred vision. Axox4.

## 2024-10-14 NOTE — Discharge Instructions (Addendum)
 The raised area on your scalp appears most similar to a dermoid cyst, this is something that can be removed but is typically done so by a dermatologist or a surgeon.  I have given you the contact information for [employer] [facility], please contact their office to establish care since you do not have a primary care provider locally.  You may benefit from a referral to a dermatologist or surgeon, please discuss this with them at your follow-up visit.  Return to the emergency department if your symptoms worsen.

## 2024-12-31 ENCOUNTER — Institutional Professional Consult (permissible substitution): Payer: MEDICAID
